# Patient Record
Sex: Female | Born: 1935 | Hispanic: No | Smoking: Never smoker
Health system: Southern US, Community
[De-identification: ages and names within clinical notes are randomized; demographics above are authoritative.]

## PROBLEM LIST (undated history)

## (undated) DIAGNOSIS — S2249XA Multiple fractures of ribs, unspecified side, initial encounter for closed fracture: Secondary | ICD-10-CM

## (undated) DIAGNOSIS — M1990 Unspecified osteoarthritis, unspecified site: Secondary | ICD-10-CM

## (undated) DIAGNOSIS — S2239XA Fracture of one rib, unspecified side, initial encounter for closed fracture: Secondary | ICD-10-CM

## (undated) DIAGNOSIS — I509 Heart failure, unspecified: Secondary | ICD-10-CM

## (undated) DIAGNOSIS — E785 Hyperlipidemia, unspecified: Secondary | ICD-10-CM

## (undated) DIAGNOSIS — I10 Essential (primary) hypertension: Secondary | ICD-10-CM

## (undated) DIAGNOSIS — Z780 Asymptomatic menopausal state: Secondary | ICD-10-CM

## (undated) DIAGNOSIS — R1310 Dysphagia, unspecified: Secondary | ICD-10-CM

## (undated) DIAGNOSIS — I2510 Atherosclerotic heart disease of native coronary artery without angina pectoris: Secondary | ICD-10-CM

## (undated) DIAGNOSIS — K760 Fatty (change of) liver, not elsewhere classified: Secondary | ICD-10-CM

## (undated) HISTORY — DX: Fracture of one rib, unspecified side, initial encounter for closed fracture: S22.39XA

## (undated) HISTORY — PX: BREAST BIOPSY: SHX20

## (undated) HISTORY — PX: APPENDECTOMY: SHX54

## (undated) HISTORY — DX: Hyperlipidemia, unspecified: E78.5

## (undated) HISTORY — DX: Essential (primary) hypertension: I10

## (undated) HISTORY — PX: OTHER SURGICAL HISTORY: SHX169

## (undated) HISTORY — DX: Atherosclerotic heart disease of native coronary artery without angina pectoris: I25.10

## (undated) HISTORY — DX: Multiple fractures of ribs, unspecified side, initial encounter for closed fracture: S22.49XA

## (undated) HISTORY — DX: Asymptomatic menopausal state: Z78.0

## (undated) HISTORY — PX: BREAST SURGERY: SHX581

## (undated) HISTORY — DX: Heart failure, unspecified: I50.9

## (undated) HISTORY — DX: Unspecified osteoarthritis, unspecified site: M19.90

---

## 1958-10-15 HISTORY — PX: BLADDER SURGERY: SHX569

## 2004-10-15 DIAGNOSIS — I2510 Atherosclerotic heart disease of native coronary artery without angina pectoris: Secondary | ICD-10-CM

## 2004-10-15 HISTORY — DX: Atherosclerotic heart disease of native coronary artery without angina pectoris: I25.10

## 2010-03-01 ENCOUNTER — Encounter: Payer: Self-pay | Admitting: Cardiology

## 2011-11-21 DIAGNOSIS — H524 Presbyopia: Secondary | ICD-10-CM | POA: Diagnosis not present

## 2011-11-21 DIAGNOSIS — E119 Type 2 diabetes mellitus without complications: Secondary | ICD-10-CM | POA: Diagnosis not present

## 2011-11-21 DIAGNOSIS — H2589 Other age-related cataract: Secondary | ICD-10-CM | POA: Diagnosis not present

## 2011-11-21 DIAGNOSIS — H4010X0 Unspecified open-angle glaucoma, stage unspecified: Secondary | ICD-10-CM | POA: Diagnosis not present

## 2011-12-31 DIAGNOSIS — H4010X0 Unspecified open-angle glaucoma, stage unspecified: Secondary | ICD-10-CM | POA: Diagnosis not present

## 2012-01-07 ENCOUNTER — Other Ambulatory Visit (HOSPITAL_COMMUNITY)
Admission: RE | Admit: 2012-01-07 | Discharge: 2012-01-07 | Disposition: A | Discharge status: Home / Self Care | Payer: Medicare Other | Source: Ambulatory Visit | Attending: Obstetrics and Gynecology | Admitting: Obstetrics and Gynecology

## 2012-01-07 ENCOUNTER — Other Ambulatory Visit: Payer: Self-pay | Admitting: Obstetrics and Gynecology

## 2012-01-07 DIAGNOSIS — Z124 Encounter for screening for malignant neoplasm of cervix: Secondary | ICD-10-CM | POA: Diagnosis not present

## 2012-01-07 DIAGNOSIS — Z1231 Encounter for screening mammogram for malignant neoplasm of breast: Secondary | ICD-10-CM

## 2012-01-07 DIAGNOSIS — Z01419 Encounter for gynecological examination (general) (routine) without abnormal findings: Secondary | ICD-10-CM | POA: Diagnosis not present

## 2012-01-15 ENCOUNTER — Ambulatory Visit (HOSPITAL_BASED_OUTPATIENT_CLINIC_OR_DEPARTMENT_OTHER)
Admission: RE | Admit: 2012-01-15 | Discharge: 2012-01-15 | Disposition: A | Discharge status: Home / Self Care | Payer: Medicare Other | Source: Ambulatory Visit | Attending: Obstetrics and Gynecology | Admitting: Obstetrics and Gynecology

## 2012-01-15 DIAGNOSIS — Z1231 Encounter for screening mammogram for malignant neoplasm of breast: Secondary | ICD-10-CM

## 2012-01-30 ENCOUNTER — Ambulatory Visit (INDEPENDENT_AMBULATORY_CARE_PROVIDER_SITE_OTHER): Payer: Medicare Other | Admitting: Internal Medicine

## 2012-01-30 ENCOUNTER — Encounter: Payer: Self-pay | Admitting: Internal Medicine

## 2012-01-30 VITALS — BP 158/72 | HR 65 | Temp 97.1°F | Resp 16 | Ht <= 58 in | Wt 134.0 lb

## 2012-01-30 VITALS — BP 148/82 | HR 68 | Temp 98.0°F | Resp 16 | Ht 63.0 in | Wt 134.0 lb

## 2012-01-30 DIAGNOSIS — M949 Disorder of cartilage, unspecified: Secondary | ICD-10-CM

## 2012-01-30 DIAGNOSIS — I2510 Atherosclerotic heart disease of native coronary artery without angina pectoris: Secondary | ICD-10-CM

## 2012-01-30 DIAGNOSIS — IMO0001 Reserved for inherently not codable concepts without codable children: Secondary | ICD-10-CM | POA: Diagnosis not present

## 2012-01-30 DIAGNOSIS — I509 Heart failure, unspecified: Secondary | ICD-10-CM | POA: Diagnosis not present

## 2012-01-30 DIAGNOSIS — M8580 Other specified disorders of bone density and structure, unspecified site: Secondary | ICD-10-CM | POA: Insufficient documentation

## 2012-01-30 DIAGNOSIS — H409 Unspecified glaucoma: Secondary | ICD-10-CM | POA: Insufficient documentation

## 2012-01-30 DIAGNOSIS — M1990 Unspecified osteoarthritis, unspecified site: Secondary | ICD-10-CM

## 2012-01-30 DIAGNOSIS — I10 Essential (primary) hypertension: Secondary | ICD-10-CM

## 2012-01-30 DIAGNOSIS — M899 Disorder of bone, unspecified: Secondary | ICD-10-CM | POA: Diagnosis not present

## 2012-01-30 DIAGNOSIS — E785 Hyperlipidemia, unspecified: Secondary | ICD-10-CM

## 2012-01-30 NOTE — Progress Notes (Signed)
Subjective:    Patient ID: Brenda Spence, female    DOB: 1926/12/01, 78 y.o.   MRN: [id number]  HPI New patient here for first visit. Patient has been in [place] for the past 7 months. Former primary care has been in [place] and in [place].    Past medical history of diabetes since age 28, high blood pressure, arthritis involving her hands and her hip, congestive heart failure, coronary artery disease, status post stenting,  Hyperlipidemia, glaucoma, and osteopenia,  Brenda Spence is overall doing well she does report that in March she had a Pap smear by Dr. Chilton Si and was told it was normal at that time. She is taking all of her medications and does not have any acute complaints today. She does have an appointment with low-power cardiology but would like to see a cardiologist here in our building. She's not had her hemoglobin A1c checked in several months.  She reports no chest pain, no SOB or LE edema.   Not on File Past Medical History  Diagnosis Date  . CHF (congestive heart failure)   . Diabetes mellitus   . Hyperlipidemia   . Hypertension   . Arthritis   . Glaucoma    History reviewed. No pertinent past surgical history. History   Social History  . Marital Status: Widowed    Spouse Name: N/A    Number of Children: N/A  . Years of Education: N/A   Occupational History  . Not on file.   Social History Main Topics  . Smoking status: Never Smoker   . Smokeless tobacco: Never Used  . Alcohol Use: No  . Drug Use: No  . Sexually Active: No   Other Topics Concern  . Not on file   Social History Narrative  . No narrative on file   History reviewed. No pertinent family history. Patient Active Problem List  Diagnoses  . Type II or unspecified type diabetes mellitus without mention of complication, uncontrolled  . CHF (congestive heart failure)  . Essential hypertension, benign  . Other and unspecified hyperlipidemia  . DJD (degenerative joint disease)  . CAD  (coronary artery disease)  . Glaucoma  . Osteopenia   Current Outpatient Prescriptions on File Prior to Visit  Medication Sig Dispense Refill  . amLODipine-benazepril (LOTREL) 5-20 MG per capsule Take 1 capsule by mouth daily.      . carvedilol (COREG) 12.5 MG tablet Take 12.5 mg by mouth 2 (two) times daily with a meal.      . furosemide (LASIX) 40 MG tablet Take 40 mg by mouth daily. Takes 1/2 daily      . glyBURIDE-metformin (GLUCOVANCE) 5-500 MG per tablet Take 1 tablet by mouth 2 (two) times daily.      . potassium chloride SA (K-DUR,KLOR-CON) 20 MEQ tablet Take 20 mEq by mouth daily. Takes 1/2      . rosuvastatin (CRESTOR) 20 MG tablet Take 20 mg by mouth daily.            Review of Systems See HPI    Objective:   Physical Exam Physical Exam  Nursing note and vitals reviewed.  Constitutional: She is oriented to person, place, and time. She appears well-developed and well-nourished.  HENT:  Head: Normocephalic and atraumatic.  Cardiovascular: Normal rate and regular rhythm. Exam reveals no gallop and no friction rub.  No murmur heard.  Pulmonary/Chest: Breath sounds normal. She has no wheezes. She has no rales.  Neurological: She is alert and oriented to person,  place, and time.  Skin: Skin is warm and dry.  Psychiatric: She has a normal mood and affect. Her behavior is normal.        Assessment & Plan:  1)  Diabetes:  Will check Hgb A1C ,  Continue current meds.  Further Management based on labs results.   2)  HTN:  Not at goal.  May need med adjustment.  Will recheck at next visit and adjust if she still has systolic HTN. 3)  Hyperlipidemia  On Crestor check tody 4)  CHF/CAD/ s/p stenting.  Will make appt with Dr. Jens Som as she would like to see cardiologist in this building.  EKG LVH nospecific ST-t achanges  SR 5)  DJD 6)  Osteopenia per her history  Will need old records.  7)  Glaucoma: she has opthalmologist

## 2012-01-30 NOTE — Patient Instructions (Signed)
Will schedule appt with Dr. Jens Som  See me in 3 months for diabetes check up  Call if any problems

## 2012-01-31 ENCOUNTER — Telehealth: Payer: Self-pay | Admitting: Internal Medicine

## 2012-01-31 LAB — LIPID PANEL
Cholesterol: 139 mg/dL (ref 0–200)
HDL: 44 mg/dL (ref >39)
LDL Cholesterol: 62 mg/dL (ref 0–99)
Total CHOL/HDL Ratio: 3.2 Ratio
Triglycerides: 165 mg/dL — ABNORMAL HIGH (ref <150)
VLDL: 33 mg/dL (ref 0–40)

## 2012-01-31 LAB — CBC WITH DIFFERENTIAL/PLATELET
Eosinophils Relative: 6 % — ABNORMAL HIGH (ref 0–5)
HCT: 37.2 % (ref 36.0–46.0)
Hemoglobin: 12.1 g/dL (ref 12.0–15.0)
Lymphocytes Relative: 35 % (ref 12–46)
Lymphs Abs: 2.3 10*3/uL (ref 0.7–4.0)
MCV: 92.3 fL (ref 78.0–100.0)
Monocytes Absolute: 0.4 10*3/uL (ref 0.1–1.0)
Monocytes Relative: 7 % (ref 3–12)
Neutro Abs: 3.4 10*3/uL (ref 1.7–7.7)
RBC: 4.03 MIL/uL (ref 3.87–5.11)
RDW: 11.7 % (ref 11.5–15.5)
WBC: 6.6 10*3/uL (ref 4.0–10.5)

## 2012-01-31 LAB — COMPREHENSIVE METABOLIC PANEL
ALT: 15 U/L (ref 0–35)
BUN: 25 mg/dL — ABNORMAL HIGH (ref 6–23)
CO2: 30 mEq/L (ref 19–32)
Creat: 1.19 mg/dL — ABNORMAL HIGH (ref 0.50–1.10)
Total Bilirubin: 0.6 mg/dL (ref 0.3–1.2)

## 2012-01-31 NOTE — Telephone Encounter (Signed)
Spoke with pt.  See labs.  Creatinine slighly elevated on 40 mg daily lasix.  She reports she has not been drinking much fluids.  Advised to increase fluid intake and I will recheck labs in 6 weeks.  Calcium also slightly high.   She has been on "two calcium tablets daily"   Advised to reduce calcium to one tablet a day and I will recheck in 6 weeks  Hgb AIC may need med adjustment but will recheck at next visit.   She voices understanding

## 2012-02-04 ENCOUNTER — Encounter: Payer: Self-pay | Admitting: *Deleted

## 2012-02-08 NOTE — Progress Notes (Signed)
Copy of labs mailed to pt.

## 2012-02-12 ENCOUNTER — Ambulatory Visit (HOSPITAL_BASED_OUTPATIENT_CLINIC_OR_DEPARTMENT_OTHER): Payer: Self-pay

## 2012-02-14 NOTE — Progress Notes (Signed)
  Subjective:    Patient ID: Brenda Spence, female    DOB: 06/08/1927, 78 y.o.   MRN: [id number]  HPI    Review of Systems     Objective:   Physical Exam        Assessment & Plan:  Error

## 2012-02-25 ENCOUNTER — Encounter: Payer: Self-pay | Admitting: Internal Medicine

## 2012-02-26 DIAGNOSIS — H409 Unspecified glaucoma: Secondary | ICD-10-CM | POA: Diagnosis not present

## 2012-02-28 ENCOUNTER — Ambulatory Visit: Payer: Self-pay | Admitting: Internal Medicine

## 2012-03-03 ENCOUNTER — Telehealth: Payer: Self-pay | Admitting: Internal Medicine

## 2012-03-03 NOTE — Telephone Encounter (Signed)
Called patient and infomred her Dr. Would need to see her , gave her an appt in am.

## 2012-03-03 NOTE — Telephone Encounter (Signed)
Pt needs office visit

## 2012-03-03 NOTE — Telephone Encounter (Signed)
Patient called in requesting an antibiotic Rx to be called in for her.  She has had a productive cough since 02/21/12.  I instructed patient that most likely Dr. Constance Goltz would want to see her to evaluate her first.  I told her either the nurse or Dr. Eulah Citizen call her back to confirm her plans.

## 2012-03-04 ENCOUNTER — Ambulatory Visit (INDEPENDENT_AMBULATORY_CARE_PROVIDER_SITE_OTHER): Payer: Medicare Other | Admitting: Internal Medicine

## 2012-03-04 ENCOUNTER — Encounter: Payer: Self-pay | Admitting: Internal Medicine

## 2012-03-04 ENCOUNTER — Ambulatory Visit (HOSPITAL_BASED_OUTPATIENT_CLINIC_OR_DEPARTMENT_OTHER)
Admission: RE | Admit: 2012-03-04 | Discharge: 2012-03-04 | Disposition: A | Discharge status: Home / Self Care | Payer: Medicare Other | Source: Ambulatory Visit | Attending: Internal Medicine | Admitting: Internal Medicine

## 2012-03-04 VITALS — BP 143/75 | HR 77 | Temp 97.3°F | Ht 63.0 in | Wt 134.0 lb

## 2012-03-04 DIAGNOSIS — J40 Bronchitis, not specified as acute or chronic: Secondary | ICD-10-CM

## 2012-03-04 DIAGNOSIS — R059 Cough, unspecified: Secondary | ICD-10-CM | POA: Insufficient documentation

## 2012-03-04 DIAGNOSIS — R0602 Shortness of breath: Secondary | ICD-10-CM | POA: Insufficient documentation

## 2012-03-04 DIAGNOSIS — J189 Pneumonia, unspecified organism: Secondary | ICD-10-CM

## 2012-03-04 DIAGNOSIS — R799 Abnormal finding of blood chemistry, unspecified: Secondary | ICD-10-CM | POA: Diagnosis not present

## 2012-03-04 DIAGNOSIS — I700 Atherosclerosis of aorta: Secondary | ICD-10-CM | POA: Diagnosis not present

## 2012-03-04 DIAGNOSIS — R7989 Other specified abnormal findings of blood chemistry: Secondary | ICD-10-CM

## 2012-03-04 DIAGNOSIS — R05 Cough: Secondary | ICD-10-CM | POA: Insufficient documentation

## 2012-03-04 LAB — CBC WITH DIFFERENTIAL/PLATELET
Basophils Absolute: 0 10*3/uL (ref 0.0–0.1)
Basophils Relative: 0 % (ref 0–1)
Eosinophils Relative: 6 % — ABNORMAL HIGH (ref 0–5)
Lymphocytes Relative: 22 % (ref 12–46)
MCHC: 32.8 g/dL (ref 30.0–36.0)
Neutro Abs: 4.7 10*3/uL (ref 1.7–7.7)
Platelets: 497 10*3/uL — ABNORMAL HIGH (ref 150–400)
RDW: 12.4 % (ref 11.5–15.5)
WBC: 7.4 10*3/uL (ref 4.0–10.5)

## 2012-03-04 LAB — COMPREHENSIVE METABOLIC PANEL
ALT: 12 U/L (ref 0–35)
CO2: 26 mEq/L (ref 19–32)
Calcium: 9.1 mg/dL (ref 8.4–10.5)
Chloride: 101 mEq/L (ref 96–112)
Potassium: 4.7 mEq/L (ref 3.5–5.3)
Sodium: 139 mEq/L (ref 135–145)
Total Protein: 7.2 g/dL (ref 6.0–8.3)

## 2012-03-04 MED ORDER — CEFTRIAXONE SODIUM 1 G IJ SOLR
500.0000 mg | Freq: Once | INTRAMUSCULAR | Status: AC
  Administered 2012-03-04: 500 mg via INTRAMUSCULAR

## 2012-03-04 MED ORDER — AZITHROMYCIN 250 MG PO TABS: ORAL_TABLET | ORAL | Status: AC

## 2012-03-04 NOTE — Patient Instructions (Signed)
See me on Thursday afternoon

## 2012-03-04 NOTE — Progress Notes (Signed)
Subjective:    Patient ID: Brenda Spence, female    DOB: 10-01-1927, 78 y.o.   MRN: [id number]  HPI Kamoria is here with her daughter.  She has been having 2 weeks of chest congestion and cough,.  Chest pain occures when coughing.  T 99.5 at home.  Some DOE.  Cough productive of green mucous.  She has had laryngitis over last 2 days  No Known Allergies Past Medical History  Diagnosis Date  . Diabetes mellitus   . Post-menopausal   . Heart disease 2006    CHF  . Broken ribs   . CHF (congestive heart failure)   . Diabetes mellitus   . Hyperlipidemia   . Hypertension   . Arthritis   . Glaucoma    Past Surgical History  Procedure Date  . Breast surgery     fatty tissue  . Appendectomy   . Bladder surgery 1960  . Stent implant     heart   History   Social History  . Marital Status: Widowed    Spouse Name: N/A    Number of Children: N/A  . Years of Education: N/A   Occupational History  . retired    Social History Main Topics  . Smoking status: Never Smoker   . Smokeless tobacco: Never Used  . Alcohol Use: No  . Drug Use: No  . Sexually Active: No   Other Topics Concern  . Not on file   Social History Narrative   ** Merged History Encounter **    Family History  Problem Relation Age of Onset  . Diabetes Sister   . Cancer Sister     stomach  . Stroke Brother    Patient Active Problem List  Diagnoses  . Type II or unspecified type diabetes mellitus without mention of complication, uncontrolled  . CHF (congestive heart failure)  . Essential hypertension, benign  . Other and unspecified hyperlipidemia  . DJD (degenerative joint disease)  . CAD (coronary artery disease)  . Glaucoma  . Osteopenia   Current Outpatient Prescriptions on File Prior to Visit  Medication Sig Dispense Refill  . amLODipine-benazepril (LOTREL) 5-20 MG per capsule Take 1 capsule by mouth 2 (two) times daily.       Marland Kitchen aspirin 81 MG tablet Take 81 mg by mouth daily.      .  carvedilol (COREG) 12.5 MG tablet Take 12.5 mg by mouth 2 (two) times daily with a meal.      . clopidogrel (PLAVIX) 75 MG tablet Take 75 mg by mouth daily.      . furosemide (LASIX) 40 MG tablet Take 40 mg by mouth daily. Takes 1/2 daily      . glyBURIDE-metformin (GLUCOVANCE) 5-500 MG per tablet Take 1 tablet by mouth 2 (two) times daily.      Marland Kitchen latanoprost (XALATAN) 0.005 % ophthalmic solution 1 drop at bedtime.      . potassium chloride SA (K-DUR,KLOR-CON) 20 MEQ tablet Take 20 mEq by mouth daily. Takes 1/2      . rosuvastatin (CRESTOR) 20 MG tablet Take 20 mg by mouth daily.      Marland Kitchen DISCONTD: amLODipine-benazepril (LOTREL) 5-20 MG per capsule       . DISCONTD: carvedilol (COREG) 12.5 MG tablet       . DISCONTD: furosemide (LASIX) 40 MG tablet Take 40 mg by mouth daily. Take 1/2 daily      . DISCONTD: glyBURIDE-metformin (GLUCOVANCE) 5-500 MG per tablet Take 1 tablet by mouth  2 (two) times daily.      Marland Kitchen DISCONTD: rosuvastatin (CRESTOR) 20 MG tablet Take 20 mg by mouth daily.       No current facility-administered medications on file prior to visit.       Review of Systems    see HPI Objective:   Physical Exam Physical Exam  Nursing note and vitals reviewed.  Constitutional: She is oriented to person, place, and time. She appears well-developed and well-nourished.  HENT:  Head: Normocephalic and atraumatic.  Cardiovascular: Normal rate and regular rhythm. Exam reveals no gallop and no friction rub.  No murmur heard.  Pulmonary/Chest: Breath sounds normal. She has no wheezes. She has no rales. Lungs  Few rales L base Neurological: She is alert and oriented to person, place, and time.  Skin: Skin is warm and dry.  Psychiatric: She has a normal mood and affect. Her behavior is normal.         Assessment & Plan:  1)  Probable early pneumonia  Will get CXR today . Rocephin 500 mg in office and Z-pak  .  OTC cough med for now 2)  Hypercalcemia  Will repeat today 3)  Elevated  creatianine.  She is taking 1/2 of her 40 mg lasix dose .  Rechedk today

## 2012-03-04 NOTE — Progress Notes (Signed)
Here today for cough- was productive, c/o congestion in chest. Also needs refill on Crestor

## 2012-03-06 ENCOUNTER — Ambulatory Visit (INDEPENDENT_AMBULATORY_CARE_PROVIDER_SITE_OTHER): Payer: Medicare Other | Admitting: Internal Medicine

## 2012-03-06 ENCOUNTER — Ambulatory Visit (HOSPITAL_BASED_OUTPATIENT_CLINIC_OR_DEPARTMENT_OTHER)
Admission: RE | Admit: 2012-03-06 | Discharge: 2012-03-06 | Disposition: A | Discharge status: Home / Self Care | Payer: Medicare Other | Source: Ambulatory Visit | Attending: Internal Medicine | Admitting: Internal Medicine

## 2012-03-06 ENCOUNTER — Encounter: Payer: Self-pay | Admitting: Internal Medicine

## 2012-03-06 VITALS — BP 160/74 | HR 76 | Temp 97.5°F | Resp 16 | Ht 65.0 in | Wt 137.0 lb

## 2012-03-06 DIAGNOSIS — N3289 Other specified disorders of bladder: Secondary | ICD-10-CM | POA: Insufficient documentation

## 2012-03-06 DIAGNOSIS — IMO0001 Reserved for inherently not codable concepts without codable children: Secondary | ICD-10-CM | POA: Diagnosis not present

## 2012-03-06 DIAGNOSIS — N289 Disorder of kidney and ureter, unspecified: Secondary | ICD-10-CM

## 2012-03-06 DIAGNOSIS — N1330 Unspecified hydronephrosis: Secondary | ICD-10-CM | POA: Diagnosis not present

## 2012-03-06 LAB — HEMOGLOBIN A1C: Mean Plasma Glucose: 169 mg/dL — ABNORMAL HIGH (ref <117)

## 2012-03-06 NOTE — Progress Notes (Signed)
Subjective:    Patient ID: Brenda Spence, female    DOB: 03-29-1927, 78 y.o.   MRN: [id number]  HPI Brenda Spence is here for follow up poss early pneumonia  .  She is feeling much better.  Less cough.  No fever.   See labs creatinine increasing. She is on Furosemide 20 mg daily down from 40 mg. Her calcium is normal now she is on one tablet daily.  She has not had glucometer strips so has not been checking her FBS   No Known Allergies Past Medical History  Diagnosis Date  . Diabetes mellitus   . Post-menopausal   . Heart disease 2006    CHF  . Broken ribs   . CHF (congestive heart failure)   . Diabetes mellitus   . Hyperlipidemia   . Hypertension   . Arthritis   . Glaucoma    Past Surgical History  Procedure Date  . Breast surgery     fatty tissue  . Appendectomy   . Bladder surgery 1960  . Stent implant     heart   History   Social History  . Marital Status: Widowed    Spouse Name: N/A    Number of Children: N/A  . Years of Education: N/A   Occupational History  . retired    Social History Main Topics  . Smoking status: Never Smoker   . Smokeless tobacco: Never Used  . Alcohol Use: No  . Drug Use: No  . Sexually Active: No   Other Topics Concern  . Not on file   Social History Narrative   ** Merged History Encounter **    Family History  Problem Relation Age of Onset  . Diabetes Sister   . Cancer Sister     stomach  . Stroke Brother    Patient Active Problem List  Diagnoses  . Type II or unspecified type diabetes mellitus without mention of complication, uncontrolled  . CHF (congestive heart failure)  . Essential hypertension, benign  . Other and unspecified hyperlipidemia  . DJD (degenerative joint disease)  . CAD (coronary artery disease)  . Glaucoma  . Osteopenia  . Hypercalcemia  . Elevated serum creatinine   Current Outpatient Prescriptions on File Prior to Visit  Medication Sig Dispense Refill  . amLODipine-benazepril (LOTREL) 5-20 MG  per capsule Take 1 capsule by mouth 2 (two) times daily.       Marland Kitchen aspirin 81 MG tablet Take 81 mg by mouth daily.      Marland Kitchen azithromycin (ZITHROMAX Z-PAK) 250 MG tablet Take as directed  Z-pak  6 each  0  . carvedilol (COREG) 12.5 MG tablet Take 12.5 mg by mouth 2 (two) times daily with a meal.      . clopidogrel (PLAVIX) 75 MG tablet Take 75 mg by mouth daily.      . furosemide (LASIX) 40 MG tablet Take 40 mg by mouth daily. Takes 1/2 daily      . glyBURIDE-metformin (GLUCOVANCE) 5-500 MG per tablet Take 1 tablet by mouth 2 (two) times daily.      Marland Kitchen latanoprost (XALATAN) 0.005 % ophthalmic solution 1 drop at bedtime.      . potassium chloride SA (K-DUR,KLOR-CON) 20 MEQ tablet Take 20 mEq by mouth daily. Takes 1/2      . rosuvastatin (CRESTOR) 20 MG tablet Take 20 mg by mouth daily.            Review of Systems See HPI    Objective:  Physical Exam Physical Exam  Nursing note and vitals reviewed.  Constitutional: She is oriented to person, place, and time. She appears well-developed and well-nourished.  HENT:  Head: Normocephalic and atraumatic.  Cardiovascular: Normal rate and regular rhythm. Exam reveals no gallop and no friction rub.  No murmur heard.  Pulmonary/Chest: Breath sounds normal. She has no wheezes. She has no rales.  Neurological: She is alert and oriented to person, place, and time.  Skin: Skin is warm and dry.  Psychiatric: She has a normal mood and affect. Her behavior is normal.         Assessment & Plan:  1)  Bronchitis/poss early pneumonia.  Improving 2)  Renal insufficiency:  Will get renal ultrasound, get micral today.  She is on ACE I.  Will refer to nephrology.  Pt also counseled to discontinue her postassium tablets 3)   DM  Check AIC today, urine micral 4)  HTN  No at goal.

## 2012-03-06 NOTE — Patient Instructions (Signed)
Stop potassium pill   Will set up referral to kidney doctors  To have kidney ultrasound test.    Labs will be mailed to you

## 2012-03-12 ENCOUNTER — Encounter: Payer: Self-pay | Admitting: Cardiology

## 2012-03-12 ENCOUNTER — Ambulatory Visit (INDEPENDENT_AMBULATORY_CARE_PROVIDER_SITE_OTHER): Payer: Medicare Other | Admitting: Cardiology

## 2012-03-12 VITALS — BP 180/76 | HR 72 | Ht 63.0 in | Wt 136.0 lb

## 2012-03-12 DIAGNOSIS — R0609 Other forms of dyspnea: Secondary | ICD-10-CM

## 2012-03-12 DIAGNOSIS — I509 Heart failure, unspecified: Secondary | ICD-10-CM | POA: Diagnosis not present

## 2012-03-12 DIAGNOSIS — R0989 Other specified symptoms and signs involving the circulatory and respiratory systems: Secondary | ICD-10-CM | POA: Diagnosis not present

## 2012-03-12 DIAGNOSIS — E785 Hyperlipidemia, unspecified: Secondary | ICD-10-CM | POA: Diagnosis not present

## 2012-03-12 DIAGNOSIS — R0600 Dyspnea, unspecified: Secondary | ICD-10-CM

## 2012-03-12 DIAGNOSIS — I2510 Atherosclerotic heart disease of native coronary artery without angina pectoris: Secondary | ICD-10-CM | POA: Diagnosis not present

## 2012-03-12 NOTE — Progress Notes (Signed)
HPI: 78 year old female for evaluation of coronary artery disease. Patient has had previous PCI of the right coronary artery in 2006. Patient previously cared for in [facility]. She moved here approximately one year ago to be close to her daughter. She has noticed some dyspnea on exertion but there is no orthopnea, PND, palpitations, syncope or chest pain. Occasional mild pedal edema.  Current Outpatient Prescriptions  Medication Sig Dispense Refill  . amLODipine-benazepril (LOTREL) 5-20 MG per capsule Take 1 capsule by mouth 2 (two) times daily.       Marland Kitchen aspirin 81 MG tablet Take 81 mg by mouth daily.      . carvedilol (COREG) 12.5 MG tablet Take 12.5 mg by mouth 2 (two) times daily with a meal.      . clopidogrel (PLAVIX) 75 MG tablet Take 75 mg by mouth daily.      . furosemide (LASIX) 40 MG tablet Take 20 mg by mouth daily.       Marland Kitchen glyBURIDE-metformin (GLUCOVANCE) 5-500 MG per tablet Take 1 tablet by mouth 2 (two) times daily.      Marland Kitchen latanoprost (XALATAN) 0.005 % ophthalmic solution Place 1 drop into both eyes at bedtime.       . rosuvastatin (CRESTOR) 20 MG tablet Take 20 mg by mouth daily.        No Known Allergies  Past Medical History  Diagnosis Date  . Post-menopausal   . CAD (coronary artery disease) 2006    PCI of RCA in [place], [place]  . Broken ribs   . CHF (congestive heart failure)   . Diabetes mellitus   . Hyperlipidemia   . Hypertension   . Arthritis   . Glaucoma     Past Surgical History  Procedure Date  . Breast surgery     fatty tissue  . Appendectomy   . Bladder surgery 1960  . Stent implant     heart    History   Social History  . Marital Status: Widowed    Spouse Name: N/A    Number of Children: 4  . Years of Education: N/A   Occupational History  . retired    Social History Main Topics  . Smoking status: Never Smoker   . Smokeless tobacco: Never Used  . Alcohol Use: No  . Drug Use: No  . Sexually Active: No   Other Topics  Concern  . Not on file   Social History Narrative   ** Merged History Encounter **     Family History  Problem Relation Age of Onset  . Diabetes Sister   . Cancer Sister     stomach  . Stroke Brother   . Coronary artery disease Mother     ROS:  no fevers or chills, productive cough, hemoptysis, dysphasia, odynophagia, melena, hematochezia, dysuria, hematuria, rash, seizure activity, orthopnea, PND, pedal edema, claudication. Remaining systems are negative.  Physical Exam:   Blood pressure 180/76, pulse 72, height 5\' 3"  (1.6 m), weight 61.707 kg (136 lb 0.6 oz).  General:  Well developed/well nourished in NAD Skin warm/dry Patient not depressed No peripheral clubbing Back-normal HEENT-normal/normal eyelids Neck supple/normal carotid upstroke bilaterally; no bruits; no JVD; no thyromegaly chest - CTA/ normal expansion CV - RRR/normal S1 and S2; no murmurs, rubs or gallops;  PMI nondisplaced Abdomen -NT/ND, no HSM, no mass, + bowel sounds, no bruit 2+ femoral pulses, no bruits Ext-trace edema, no chords, 2+ DP Neuro-grossly nonfocal  ECG 01/30/2012-sinus rhythm at a rate of 63.  Left ventricular hypertrophy. Repolarization abnormalities.

## 2012-03-12 NOTE — Assessment & Plan Note (Signed)
Patient carries the diagnosis of congestive heart failure. Continue present medications. Check BNP given complain of dyspnea. Will increase diuretics if needed.

## 2012-03-12 NOTE — Patient Instructions (Signed)
Your physician wants you to follow-up in: 6 MONTHS WITH DR Jens Som You will receive a reminder letter in the mail two months in advance. If you don't receive a letter, please call our office to schedule the follow-up appointment.   Your physician has requested that you have a lexiscan myoview. For further information please visit [email or web address] Please follow instruction sheet, as given.   STOP PLAVIX  Your physician recommends that you HAVE LAB WORK TODAY

## 2012-03-12 NOTE — Assessment & Plan Note (Signed)
Blood pressure is elevated. Continue present medications. I have asked her to follow her pressure at home and we will increase medications as needed.

## 2012-03-12 NOTE — Assessment & Plan Note (Signed)
Continue aspirin. Discontinue Plavix. Continue statin. She does have some dyspnea. Scheduled Myoview to exclude ischemia and quantitate LV function. Obtain records from [place] concerning previous cardiac history.

## 2012-03-12 NOTE — Assessment & Plan Note (Signed)
Continue statin. 

## 2012-03-13 LAB — BRAIN NATRIURETIC PEPTIDE: Brain Natriuretic Peptide: 65.8 pg/mL (ref 0.0–100.0)

## 2012-03-17 ENCOUNTER — Telehealth: Payer: Self-pay | Admitting: *Deleted

## 2012-03-17 ENCOUNTER — Other Ambulatory Visit: Payer: Self-pay | Admitting: Internal Medicine

## 2012-03-17 DIAGNOSIS — N1330 Unspecified hydronephrosis: Secondary | ICD-10-CM

## 2012-03-17 NOTE — Telephone Encounter (Signed)
Received a fax stating insurance did not cover  crestor and asking for substitution- please consider generics: Atorvastatin, fluvastatin, pravastatin, simvastatin- 90 day supply

## 2012-03-17 NOTE — Telephone Encounter (Signed)
Spoke with pt. And informed of ultrasound results .  Will mild hydronephrosis on Right  Will refer to urology.  She has appt with Dr. Margarita Grizzle scheduled on 6/6 this week.  I advised pt to take her daughter with her.  She voices understanding

## 2012-03-18 ENCOUNTER — Ambulatory Visit (HOSPITAL_COMMUNITY): Payer: Medicare Other | Attending: Cardiovascular Disease | Admitting: Radiology

## 2012-03-18 VITALS — BP 137/65 | Ht 63.0 in | Wt 134.0 lb

## 2012-03-18 DIAGNOSIS — E119 Type 2 diabetes mellitus without complications: Secondary | ICD-10-CM | POA: Insufficient documentation

## 2012-03-18 DIAGNOSIS — I10 Essential (primary) hypertension: Secondary | ICD-10-CM | POA: Insufficient documentation

## 2012-03-18 DIAGNOSIS — Z9861 Coronary angioplasty status: Secondary | ICD-10-CM | POA: Insufficient documentation

## 2012-03-18 DIAGNOSIS — R0609 Other forms of dyspnea: Secondary | ICD-10-CM | POA: Diagnosis not present

## 2012-03-18 DIAGNOSIS — R0602 Shortness of breath: Secondary | ICD-10-CM

## 2012-03-18 DIAGNOSIS — R5381 Other malaise: Secondary | ICD-10-CM | POA: Insufficient documentation

## 2012-03-18 DIAGNOSIS — R0989 Other specified symptoms and signs involving the circulatory and respiratory systems: Secondary | ICD-10-CM | POA: Insufficient documentation

## 2012-03-18 DIAGNOSIS — I2510 Atherosclerotic heart disease of native coronary artery without angina pectoris: Secondary | ICD-10-CM | POA: Diagnosis not present

## 2012-03-18 DIAGNOSIS — E785 Hyperlipidemia, unspecified: Secondary | ICD-10-CM | POA: Diagnosis not present

## 2012-03-18 DIAGNOSIS — R9431 Abnormal electrocardiogram [ECG] [EKG]: Secondary | ICD-10-CM | POA: Diagnosis not present

## 2012-03-18 DIAGNOSIS — I509 Heart failure, unspecified: Secondary | ICD-10-CM

## 2012-03-18 DIAGNOSIS — Z8249 Family history of ischemic heart disease and other diseases of the circulatory system: Secondary | ICD-10-CM | POA: Insufficient documentation

## 2012-03-18 MED ORDER — REGADENOSON 0.4 MG/5ML IV SOLN
0.4000 mg | Freq: Once | INTRAVENOUS | Status: AC
  Administered 2012-03-18: 0.4 mg via INTRAVENOUS

## 2012-03-18 MED ORDER — TECHNETIUM TC 99M TETROFOSMIN IV KIT
33.0000 | PACK | Freq: Once | INTRAVENOUS | Status: AC | PRN
  Administered 2012-03-18: 33 via INTRAVENOUS

## 2012-03-18 MED ORDER — TECHNETIUM TC 99M TETROFOSMIN IV KIT
11.0000 | PACK | Freq: Once | INTRAVENOUS | Status: AC | PRN
  Administered 2012-03-18: 11 via INTRAVENOUS

## 2012-03-18 NOTE — Progress Notes (Signed)
[facility] SITE 3 NUCLEAR MED [address] [place] [address] [phone]  Cardiology Nuclear Med Study  Brenda Spence is a 78 y.o. female     MRN : [id number]     DOB: [birth year]  Procedure Date: 03/18/2012  Nuclear Med Background Indication for Stress Test:  Evaluation for Ischemia and PTCA/Stent Patency History:  '06 Cath ([place])>PTCA/Stent RCA, '06 CHF, Abnormal EKG (LVH with strain) Cardiac Risk Factors: Family History - CAD, Hypertension, Lipids and NIDDM  Symptoms:  DOE, Fatigue, Fatigue with Exertion and SOB   Nuclear Pre-Procedure Caffeine/Decaff Intake:  None NPO After: 7:00am   Lungs:  clear O2 Sat: 98% on room air. IV 0.9% NS with Angio Cath:  20g  IV Site: R Antecubital  IV Started by:  Stanton Kidney, EMT-P  Chest Size (in):  36 Cup Size: B  Height: 5\' 3"  (1.6 m)  Weight:  134 lb (60.782 kg)  BMI:  Body mass index is 23.74 kg/(m^2). Tech Comments:  Meds taken as directed @ 7:30am, per patient. CBG= 212 @ 10:30am, per patient.    Nuclear Med Study 1 or 2 day study: 1 day  Stress Test Type:  Eugenie Birks  Reading MD: Charlton Haws, MD  Order Authorizing Provider:  Olga Millers, MD  Resting Radionuclide: Technetium 30m Tetrofosmin  Resting Radionuclide Dose: 11.0 mCi   Stress Radionuclide:  Technetium 39m Tetrofosmin  Stress Radionuclide Dose: 33.0 mCi           Stress Protocol Rest HR: 62 Stress HR: 91  Rest BP: 137/65 Stress BP: 141/59  Exercise Time (min): n/a METS: n/a   Predicted Max HR: 138 bpm % Max HR: 65.94 bpm Rate Pressure Product: 21308   Dose of Adenosine (mg):  n/a Dose of Lexiscan: 0.4 mg  Dose of Atropine (mg): n/a Dose of Dobutamine: n/a mcg/kg/min (at max HR)  Stress Test Technologist: Irean Hong, RN  Nuclear Technologist:  Domenic Polite, CNMT     Rest Procedure:  Myocardial perfusion imaging was performed at rest 45 minutes following the intravenous administration of Technetium 63m Tetrofosmin. Rest ECG: NSR,  LVH with replorizaion  Stress Procedure:  The patient received IV Lexiscan 0.4 mg over 15-seconds.  Technetium 40m Tetrofosmin injected at 30-seconds.  The EKG was non diagnostic due to baseline changes.  Quantitative spect images were obtained after a 45 minute delay. Stress ECG: No significant change from baseline ECG  QPS Raw Data Images:  Normal; no motion artifact; normal heart/lung ratio. Stress Images:  Normal homogeneous uptake in all areas of the myocardium. Rest Images:  Normal homogeneous uptake in all areas of the myocardium. Subtraction (SDS):  Normal Transient Ischemic Dilatation (Normal <1.22):  1.03 Lung/Heart Ratio (Normal <0.45):  0.34  Quantitative Gated Spect Images QGS EDV:  81 ml QGS ESV:  37 ml  Impression Exercise Capacity:  Lexiscan with no exercise. BP Response:  Normal blood pressure response. Clinical Symptoms:  There is dyspnea. ECG Impression:  No significant ST segment change suggestive of ischemia. Comparison with Prior Nuclear Study: No previous nuclear study performed  Overall Impression:  Normal stress nuclear study.  LV Ejection Fraction: 55%.  LV Wall Motion:  NL LV Function; NL Wall Motion    Charlton Haws

## 2012-03-20 ENCOUNTER — Telehealth: Payer: Self-pay | Admitting: *Deleted

## 2012-03-20 ENCOUNTER — Ambulatory Visit: Payer: Medicare Other | Admitting: Internal Medicine

## 2012-03-20 ENCOUNTER — Other Ambulatory Visit: Payer: Self-pay | Admitting: Internal Medicine

## 2012-03-20 ENCOUNTER — Telehealth: Payer: Self-pay | Admitting: Internal Medicine

## 2012-03-20 DIAGNOSIS — N1330 Unspecified hydronephrosis: Secondary | ICD-10-CM | POA: Diagnosis not present

## 2012-03-20 DIAGNOSIS — R3913 Splitting of urinary stream: Secondary | ICD-10-CM | POA: Diagnosis not present

## 2012-03-20 DIAGNOSIS — R339 Retention of urine, unspecified: Secondary | ICD-10-CM | POA: Diagnosis not present

## 2012-03-20 NOTE — Telephone Encounter (Signed)
Spoke with pt and informed her insurance plan does not want to pay for Crestor.  Lipids look very good.  Pt would like to try to stay lipid med and see if her cholesterol goes very high.  I am agreeable with this.  Recheck lipids 6 mlnths to one year

## 2012-03-20 NOTE — Telephone Encounter (Signed)
Copy of labs mailed to pt's home address. 

## 2012-03-20 NOTE — Telephone Encounter (Signed)
Copy of pt's labs mailed to her home address.

## 2012-03-21 ENCOUNTER — Other Ambulatory Visit (HOSPITAL_COMMUNITY): Payer: Self-pay | Admitting: Urology

## 2012-03-21 DIAGNOSIS — N1330 Unspecified hydronephrosis: Secondary | ICD-10-CM

## 2012-04-06 ENCOUNTER — Encounter: Payer: Self-pay | Admitting: Internal Medicine

## 2012-04-06 DIAGNOSIS — N1330 Unspecified hydronephrosis: Secondary | ICD-10-CM | POA: Insufficient documentation

## 2012-04-08 ENCOUNTER — Encounter (HOSPITAL_COMMUNITY)
Admission: RE | Admit: 2012-04-08 | Discharge: 2012-04-08 | Disposition: A | Discharge status: Home / Self Care | Payer: Medicare Other | Source: Ambulatory Visit | Attending: Urology | Admitting: Urology

## 2012-04-08 DIAGNOSIS — N1330 Unspecified hydronephrosis: Secondary | ICD-10-CM | POA: Diagnosis not present

## 2012-04-08 MED ORDER — FUROSEMIDE 10 MG/ML IJ SOLN
40.0000 mg | Freq: Once | INTRAMUSCULAR | Status: DC
  Filled 2012-04-08: qty 4

## 2012-04-08 MED ORDER — TECHNETIUM TC 99M MERTIATIDE
15.7000 | Freq: Once | INTRAVENOUS | Status: AC | PRN
  Administered 2012-04-08: 16 via INTRAVENOUS

## 2012-04-18 DIAGNOSIS — I10 Essential (primary) hypertension: Secondary | ICD-10-CM | POA: Diagnosis not present

## 2012-04-18 DIAGNOSIS — I129 Hypertensive chronic kidney disease with stage 1 through stage 4 chronic kidney disease, or unspecified chronic kidney disease: Secondary | ICD-10-CM | POA: Diagnosis not present

## 2012-04-21 DIAGNOSIS — N1330 Unspecified hydronephrosis: Secondary | ICD-10-CM | POA: Diagnosis not present

## 2012-04-22 ENCOUNTER — Encounter: Payer: Self-pay | Admitting: Internal Medicine

## 2012-04-22 ENCOUNTER — Ambulatory Visit (INDEPENDENT_AMBULATORY_CARE_PROVIDER_SITE_OTHER): Payer: Medicare Other | Admitting: Internal Medicine

## 2012-04-22 VITALS — BP 136/62 | HR 66 | Temp 97.2°F | Resp 16 | Ht 63.0 in | Wt 137.0 lb

## 2012-04-22 DIAGNOSIS — N289 Disorder of kidney and ureter, unspecified: Secondary | ICD-10-CM

## 2012-04-22 NOTE — Progress Notes (Signed)
Subjective:    Patient ID: Brenda Spence, female    DOB: November 12, 1926, 78 y.o.   MRN: [id number]  HPI  Keaghan is here for follow up.  She has seen both Dr. Margarita Grizzle for R sided hydronephrosis and Dr. Eliott Nine nephrology.  She has renogram pending to evaluate her upper tract for any blockage.  See scanned not Dr. Margarita Grizzle - bladder function appears fine.    No chest pain or SOB.    No numbness or tingling in toes  See AIC  She states she knows she is not following diet as well ass she should.  She is afraid to use sugar substitute and does drink sugary sodas at times.    No Known Allergies Past Medical History  Diagnosis Date  . Post-menopausal   . CAD (coronary artery disease) 2006    PCI of RCA in [place], [place]  . Broken ribs   . CHF (congestive heart failure)   . Diabetes mellitus   . Hyperlipidemia   . Hypertension   . Arthritis   . Glaucoma    Past Surgical History  Procedure Date  . Breast surgery     fatty tissue  . Appendectomy   . Bladder surgery 1960  . Stent implant     heart   History   Social History  . Marital Status: Widowed    Spouse Name: N/A    Number of Children: 4  . Years of Education: N/A   Occupational History  . retired    Social History Main Topics  . Smoking status: Never Smoker   . Smokeless tobacco: Never Used  . Alcohol Use: No  . Drug Use: No  . Sexually Active: No   Other Topics Concern  . Not on file   Social History Narrative   ** Merged History Encounter **    Family History  Problem Relation Age of Onset  . Diabetes Sister   . Cancer Sister     stomach  . Stroke Brother   . Coronary artery disease Mother    Patient Active Problem List  Diagnosis  . Type II or unspecified type diabetes mellitus without mention of complication, uncontrolled  . CHF (congestive heart failure)  . Essential hypertension, benign  . Other and unspecified hyperlipidemia  . DJD (degenerative joint disease)  . CAD (coronary artery disease)    . Glaucoma  . Osteopenia  . Hypercalcemia  . Elevated serum creatinine  . Hydronephrosis of right kidney   Current Outpatient Prescriptions on File Prior to Visit  Medication Sig Dispense Refill  . amLODipine-benazepril (LOTREL) 5-20 MG per capsule Take 1 capsule by mouth 2 (two) times daily.       Marland Kitchen aspirin 81 MG tablet Take 81 mg by mouth daily.      . carvedilol (COREG) 12.5 MG tablet Take 12.5 mg by mouth 2 (two) times daily with a meal.      . furosemide (LASIX) 40 MG tablet Take 20 mg by mouth daily.       Marland Kitchen glyBURIDE-metformin (GLUCOVANCE) 5-500 MG per tablet Take 1 tablet by mouth 2 (two) times daily.      Marland Kitchen latanoprost (XALATAN) 0.005 % ophthalmic solution Place 1 drop into both eyes at bedtime.           Review of Systems    see HPI Objective:   Physical Exam Physical Exam  Nursing note and vitals reviewed.  Constitutional: She is oriented to person, place, and time. She appears well-developed  and well-nourished.  HENT:  Head: Normocephalic and atraumatic.  Cardiovascular: Normal rate and regular rhythm. Exam reveals no gallop and no friction rub.  No murmur heard.  Pulmonary/Chest: Breath sounds normal. She has no wheezes. She has no rales.  Neurological: She is alert and oriented to person, place, and time.  Skin: Skin is warm and dry.  Psychiatric: She has a normal mood and affect. Her behavior is normal.  Foot exam  No rashes or ulcerations, good bilateral pedal pulse.  Microfilament is normal.            Assessment & Plan:  DM  On Ace, statin,  Microfilament normal,  Advised not to go barefoot and tokeep up with diabetic eye exam.  I advised nutrition consult but she declines now and wishes to change diet on her own.  Will check AIC next visit.  Advised ot avoid sugary sodas.  Htn  Good control  Renal insufficiency  Will recheck creatinine today.  She has seen Dr. Rhae Lerner.  If creatinine higher , will need to consider stopping metformin  Hyperlipidemia     CHF   See me in 3 months

## 2012-04-22 NOTE — Patient Instructions (Addendum)
See me in 3 months  Labs will be mailed to you 

## 2012-04-23 ENCOUNTER — Encounter: Payer: Self-pay | Admitting: *Deleted

## 2012-04-23 LAB — BASIC METABOLIC PANEL
BUN: 23 mg/dL (ref 6–23)
CO2: 22 mEq/L (ref 19–32)
Calcium: 9.4 mg/dL (ref 8.4–10.5)
Chloride: 106 mEq/L (ref 96–112)
Creat: 1.29 mg/dL — ABNORMAL HIGH (ref 0.50–1.10)

## 2012-05-28 ENCOUNTER — Other Ambulatory Visit: Payer: Self-pay | Admitting: *Deleted

## 2012-06-02 ENCOUNTER — Other Ambulatory Visit: Payer: Self-pay | Admitting: Internal Medicine

## 2012-06-02 ENCOUNTER — Telehealth: Payer: Self-pay | Admitting: Internal Medicine

## 2012-06-02 MED ORDER — CARVEDILOL 12.5 MG PO TABS: 12.5000 mg | ORAL_TABLET | Freq: Two times a day (BID) | ORAL | Status: DC

## 2012-06-02 MED ORDER — AMLODIPINE BESY-BENAZEPRIL HCL 5-20 MG PO CAPS: 1.0000 | ORAL_CAPSULE | Freq: Every day | ORAL | Status: DC

## 2012-06-02 NOTE — Telephone Encounter (Signed)
Pt states that her Mail order pharmacy faxed over a request and they need the request fax back to them... She states if there are any questions please call her on her cell number [phone]... Thanks

## 2012-06-02 NOTE — Telephone Encounter (Signed)
Spoke with pt regarding Lotrel medication and renal insufficiency.  Will reduce Lotrel to once a day and discontinue bid dosing to avoid hyperkalemia.  Pt voices understanding to take Lotrel only once a day. Will check BP at October visit.

## 2012-06-03 MED ORDER — AMLODIPINE BESY-BENAZEPRIL HCL 5-20 MG PO CAPS: 1.0000 | ORAL_CAPSULE | Freq: Every day | ORAL | Status: DC

## 2012-06-03 MED ORDER — CARVEDILOL 12.5 MG PO TABS: 12.5000 mg | ORAL_TABLET | Freq: Two times a day (BID) | ORAL | Status: DC

## 2012-06-17 DIAGNOSIS — H47239 Glaucomatous optic atrophy, unspecified eye: Secondary | ICD-10-CM | POA: Diagnosis not present

## 2012-06-17 DIAGNOSIS — H409 Unspecified glaucoma: Secondary | ICD-10-CM | POA: Diagnosis not present

## 2012-07-15 DIAGNOSIS — H4011X0 Primary open-angle glaucoma, stage unspecified: Secondary | ICD-10-CM | POA: Diagnosis not present

## 2012-07-15 DIAGNOSIS — H2589 Other age-related cataract: Secondary | ICD-10-CM | POA: Diagnosis not present

## 2012-07-15 DIAGNOSIS — H04129 Dry eye syndrome of unspecified lacrimal gland: Secondary | ICD-10-CM | POA: Diagnosis not present

## 2012-07-15 DIAGNOSIS — E11329 Type 2 diabetes mellitus with mild nonproliferative diabetic retinopathy without macular edema: Secondary | ICD-10-CM | POA: Diagnosis not present

## 2012-07-22 ENCOUNTER — Encounter: Payer: Self-pay | Admitting: Internal Medicine

## 2012-07-22 ENCOUNTER — Ambulatory Visit (INDEPENDENT_AMBULATORY_CARE_PROVIDER_SITE_OTHER): Payer: Medicare Other | Admitting: Internal Medicine

## 2012-07-22 VITALS — BP 195/89 | HR 64 | Temp 97.1°F | Resp 20 | Wt 137.0 lb

## 2012-07-22 DIAGNOSIS — E139 Other specified diabetes mellitus without complications: Secondary | ICD-10-CM

## 2012-07-22 DIAGNOSIS — I10 Essential (primary) hypertension: Secondary | ICD-10-CM

## 2012-07-22 DIAGNOSIS — R7989 Other specified abnormal findings of blood chemistry: Secondary | ICD-10-CM

## 2012-07-22 DIAGNOSIS — E119 Type 2 diabetes mellitus without complications: Secondary | ICD-10-CM | POA: Diagnosis not present

## 2012-07-22 DIAGNOSIS — R799 Abnormal finding of blood chemistry, unspecified: Secondary | ICD-10-CM

## 2012-07-22 LAB — BASIC METABOLIC PANEL
BUN: 19 mg/dL (ref 6–23)
Creat: 1.04 mg/dL (ref 0.50–1.10)
Potassium: 4.3 mEq/L (ref 3.5–5.3)

## 2012-07-22 LAB — HEMOGLOBIN A1C
Hgb A1c MFr Bld: 6.3 % — ABNORMAL HIGH (ref <5.7)
Mean Plasma Glucose: 134 mg/dL — ABNORMAL HIGH (ref <117)

## 2012-07-22 MED ORDER — BENAZEPRIL HCL 20 MG PO TABS: 20.0000 mg | ORAL_TABLET | Freq: Every day | ORAL | Status: DC

## 2012-07-22 MED ORDER — AMLODIPINE BESYLATE 10 MG PO TABS: 10.0000 mg | ORAL_TABLET | Freq: Every day | ORAL | Status: DC

## 2012-07-22 NOTE — Patient Instructions (Addendum)
See me in 3 weeks  Take amlodipine 10 mg once daily and Benazepril 20 mg once daily with other meds

## 2012-07-22 NOTE — Progress Notes (Signed)
Subjective:    Patient ID: Brenda Spence, female    DOB: August 05, 1927, 78 y.o.   MRN: [id number]  HPI Shaunta is here for follow up  .  Her lotrel was reduced to 5/20 once a day secondary to her renal insufficiency  See  BP she is asymptomatic and did not take her BP meds this am.   No tingling of hands or feet  No Known Allergies Past Medical History  Diagnosis Date  . Post-menopausal   . CAD (coronary artery disease) 2006    PCI of RCA in [place], [place]  . Broken ribs   . CHF (congestive heart failure)   . Diabetes mellitus   . Hyperlipidemia   . Hypertension   . Arthritis   . Glaucoma    Past Surgical History  Procedure Date  . Breast surgery     fatty tissue  . Appendectomy   . Bladder surgery 1960  . Stent implant     heart   History   Social History  . Marital Status: Widowed    Spouse Name: N/A    Number of Children: 4  . Years of Education: N/A   Occupational History  . retired    Social History Main Topics  . Smoking status: Never Smoker   . Smokeless tobacco: Never Used  . Alcohol Use: No  . Drug Use: No  . Sexually Active: No   Other Topics Concern  . Not on file   Social History Narrative   ** Merged History Encounter **    Family History  Problem Relation Age of Onset  . Diabetes Sister   . Cancer Sister     stomach  . Stroke Brother   . Coronary artery disease Mother    Patient Active Problem List  Diagnosis  . Type II or unspecified type diabetes mellitus without mention of complication, uncontrolled  . CHF (congestive heart failure)  . Essential hypertension, benign  . Other and unspecified hyperlipidemia  . DJD (degenerative joint disease)  . CAD (coronary artery disease)  . Glaucoma  . Osteopenia  . Hypercalcemia  . Elevated serum creatinine  . Hydronephrosis of right kidney   Current Outpatient Prescriptions on File Prior to Visit  Medication Sig Dispense Refill  . aspirin 81 MG tablet Take 81 mg by mouth daily.      .  carvedilol (COREG) 12.5 MG tablet Take 1 tablet (12.5 mg total) by mouth 2 (two) times daily with a meal.  180 tablet  1  . furosemide (LASIX) 40 MG tablet Take 20 mg by mouth daily.       Marland Kitchen glyBURIDE-metformin (GLUCOVANCE) 5-500 MG per tablet Take 1 tablet by mouth 2 (two) times daily.      Marland Kitchen latanoprost (XALATAN) 0.005 % ophthalmic solution Place 1 drop into both eyes at bedtime.       Marland Kitchen amLODipine (NORVASC) 10 MG tablet Take 1 tablet (10 mg total) by mouth daily.  30 tablet  1  . benazepril (LOTENSIN) 20 MG tablet Take 1 tablet (20 mg total) by mouth daily.  30 tablet  1  . clopidogrel (PLAVIX) 75 MG tablet            Review of Systems See HPI    Objective:   Physical Exam  Physical Exam  Nursing note and vitals reviewed.  Constitutional: She is oriented to person, place, and time. She appears well-developed and well-nourished.  HENT:  Head: Normocephalic and atraumatic.  Cardiovascular: Normal rate and  regular rhythm. Exam reveals no gallop and no friction rub.  No murmur heard.  Pulmonary/Chest: Breath sounds normal. She has no wheezes. She has no rales.  Neurological: She is alert and oriented to person, place, and time.  Skin: Skin is warm and dry.  Psychiatric: She has a normal mood and affect. Her behavior is normal.  Ext:  No edema            Assessment & Plan:  HTN will change med to Norvasc 10 mg and keep benazepril to 20 mg given renal insufficency.    Renal insuff  Check labs today  Diabetes  Will check AIC today  See me in 3 weeks to  Check BP  Pt repeatedly declines flu vaccine despite my counsel that she needs it

## 2012-07-24 ENCOUNTER — Telehealth: Payer: Self-pay | Admitting: *Deleted

## 2012-07-24 ENCOUNTER — Ambulatory Visit: Payer: Medicare Other | Admitting: Internal Medicine

## 2012-07-24 NOTE — Telephone Encounter (Signed)
Lab results mailed to pt.

## 2012-07-31 DIAGNOSIS — E11329 Type 2 diabetes mellitus with mild nonproliferative diabetic retinopathy without macular edema: Secondary | ICD-10-CM | POA: Diagnosis not present

## 2012-07-31 DIAGNOSIS — H43819 Vitreous degeneration, unspecified eye: Secondary | ICD-10-CM | POA: Diagnosis not present

## 2012-07-31 DIAGNOSIS — H4011X0 Primary open-angle glaucoma, stage unspecified: Secondary | ICD-10-CM | POA: Diagnosis not present

## 2012-07-31 DIAGNOSIS — E1139 Type 2 diabetes mellitus with other diabetic ophthalmic complication: Secondary | ICD-10-CM | POA: Diagnosis not present

## 2012-08-01 ENCOUNTER — Ambulatory Visit (INDEPENDENT_AMBULATORY_CARE_PROVIDER_SITE_OTHER): Payer: Medicare Other | Admitting: *Deleted

## 2012-08-01 DIAGNOSIS — Z23 Encounter for immunization: Secondary | ICD-10-CM | POA: Diagnosis not present

## 2012-08-12 ENCOUNTER — Ambulatory Visit (INDEPENDENT_AMBULATORY_CARE_PROVIDER_SITE_OTHER): Payer: Medicare Other | Admitting: Internal Medicine

## 2012-08-12 ENCOUNTER — Encounter: Payer: Self-pay | Admitting: Internal Medicine

## 2012-08-12 VITALS — BP 142/82 | HR 69 | Temp 97.2°F | Resp 16 | Wt 141.0 lb

## 2012-08-12 DIAGNOSIS — R799 Abnormal finding of blood chemistry, unspecified: Secondary | ICD-10-CM | POA: Diagnosis not present

## 2012-08-12 DIAGNOSIS — I10 Essential (primary) hypertension: Secondary | ICD-10-CM | POA: Diagnosis not present

## 2012-08-12 DIAGNOSIS — R7989 Other specified abnormal findings of blood chemistry: Secondary | ICD-10-CM

## 2012-08-12 NOTE — Progress Notes (Signed)
Subjective:    Patient ID: Brenda Spence, female    DOB: [birth year], 78 y.o.   MRN: [id number]  HPI Brenda Spence is here for follow up after initiating Norvasc. She is tolerating well but states medicine tastes bad.  She brings outpt bp's and systolic ranges from 136-144 diastolics all normal.   She is asymptomatic  No Known Allergies Past Medical History  Diagnosis Date  . Post-menopausal   . CAD (coronary artery disease) 2006    PCI of RCA in [place], [place]  . Broken ribs   . CHF (congestive heart failure)   . Diabetes mellitus   . Hyperlipidemia   . Hypertension   . Arthritis   . Glaucoma    Past Surgical History  Procedure Date  . Breast surgery     fatty tissue  . Appendectomy   . Bladder surgery 1960  . Stent implant     heart   History   Social History  . Marital Status: Widowed    Spouse Name: N/A    Number of Children: 4  . Years of Education: N/A   Occupational History  . retired    Social History Main Topics  . Smoking status: Never Smoker   . Smokeless tobacco: Never Used  . Alcohol Use: No  . Drug Use: No  . Sexually Active: No   Other Topics Concern  . Not on file   Social History Narrative   ** Merged History Encounter **    Family History  Problem Relation Age of Onset  . Diabetes Sister   . Cancer Sister     stomach  . Stroke Brother   . Coronary artery disease Mother    Patient Active Problem List  Diagnosis  . Type II or unspecified type diabetes mellitus without mention of complication, uncontrolled  . CHF (congestive heart failure)  . Essential hypertension, benign  . Other and unspecified hyperlipidemia  . DJD (degenerative joint disease)  . CAD (coronary artery disease)  . Glaucoma  . Osteopenia  . Hypercalcemia  . Elevated serum creatinine  . Hydronephrosis of right kidney   Current Outpatient Prescriptions on File Prior to Visit  Medication Sig Dispense Refill  . amLODipine (NORVASC) 10 MG tablet Take 1 tablet (10 mg  total) by mouth daily.  30 tablet  1  . aspirin 81 MG tablet Take 81 mg by mouth daily.      . benazepril (LOTENSIN) 20 MG tablet Take 1 tablet (20 mg total) by mouth daily.  30 tablet  1  . carvedilol (COREG) 12.5 MG tablet Take 1 tablet (12.5 mg total) by mouth 2 (two) times daily with a meal.  180 tablet  1  . clopidogrel (PLAVIX) 75 MG tablet       . furosemide (LASIX) 40 MG tablet Take 20 mg by mouth daily.       Marland Kitchen glyBURIDE-metformin (GLUCOVANCE) 5-500 MG per tablet Take 1 tablet by mouth 2 (two) times daily.      Marland Kitchen latanoprost (XALATAN) 0.005 % ophthalmic solution Place 1 drop into both eyes at bedtime.           Review of Systems See HPI    Objective:   Physical Exam  Physical Exam  Nursing note and vitals reviewed.  Constitutional: She is oriented to person, place, and time. She appears well-developed and well-nourished.  HENT:  Head: Normocephalic and atraumatic.  Cardiovascular: Normal rate and regular rhythm. Exam reveals no gallop and no friction rub.  No  murmur heard.  Pulmonary/Chest: Breath sounds normal. She has no wheezes. She has no rales.  Neurological: She is alert and oriented to person, place, and time.  Skin: Skin is warm and dry.  Psychiatric: She has a normal mood and affect. Her behavior is normal.  Ext no edema           Assessment & Plan:  HTN   Improved.  Continue 4 meds  DM   Good control  Renal insufficiency  Last creatinine normal  See me as needed

## 2012-08-12 NOTE — Patient Instructions (Addendum)
See me as needed 

## 2012-09-01 ENCOUNTER — Other Ambulatory Visit: Payer: Self-pay | Admitting: Internal Medicine

## 2012-09-01 NOTE — Telephone Encounter (Signed)
Needs refill

## 2012-09-01 NOTE — Telephone Encounter (Signed)
Pt needs refill of Furosemide (Tab) LASIX 40 MG Take 20 mg by mouth daily.  She does not has any left... Please send to the [facility] [place].Marland KitchenMarland Kitchen

## 2012-09-02 MED ORDER — FUROSEMIDE 40 MG PO TABS: 20.0000 mg | ORAL_TABLET | Freq: Every day | ORAL | Status: DC

## 2012-09-15 ENCOUNTER — Telehealth: Payer: Self-pay | Admitting: Internal Medicine

## 2012-09-15 NOTE — Telephone Encounter (Signed)
Pt is concern about her blood pressure.. Per pt it has been high... This morning after 8am it was 149/103... Please call pt at [phone].Marland KitchenMarland Kitchen

## 2012-09-15 NOTE — Telephone Encounter (Signed)
Brenda Spence  Call pt and if she needs to be added to schedule add her in.  Counsel her to be patient as she will be added in

## 2012-09-22 ENCOUNTER — Telehealth: Payer: Self-pay | Admitting: Internal Medicine

## 2012-09-22 NOTE — Telephone Encounter (Signed)
PT states she needs a call back because there are prescriptions that need to be refill and need Dr. Reece Levy authorization... Pt states her blood pressure is 158/109 this morning... Please call home number but also try the cell number as well... Thanks

## 2012-09-23 ENCOUNTER — Ambulatory Visit (INDEPENDENT_AMBULATORY_CARE_PROVIDER_SITE_OTHER): Payer: Medicare Other | Admitting: Internal Medicine

## 2012-09-23 ENCOUNTER — Telehealth: Payer: Self-pay | Admitting: Internal Medicine

## 2012-09-23 ENCOUNTER — Encounter: Payer: Self-pay | Admitting: Internal Medicine

## 2012-09-23 VITALS — BP 148/78 | HR 61 | Temp 97.2°F | Resp 18 | Wt 139.0 lb

## 2012-09-23 DIAGNOSIS — I10 Essential (primary) hypertension: Secondary | ICD-10-CM | POA: Diagnosis not present

## 2012-09-23 DIAGNOSIS — I509 Heart failure, unspecified: Secondary | ICD-10-CM | POA: Diagnosis not present

## 2012-09-23 MED ORDER — AMLODIPINE BESYLATE 10 MG PO TABS: 10.0000 mg | ORAL_TABLET | Freq: Every day | ORAL | Status: DC

## 2012-09-23 MED ORDER — CARVEDILOL 12.5 MG PO TABS: 12.5000 mg | ORAL_TABLET | Freq: Two times a day (BID) | ORAL | Status: DC

## 2012-09-23 MED ORDER — BENAZEPRIL HCL 20 MG PO TABS: 20.0000 mg | ORAL_TABLET | Freq: Every day | ORAL | Status: DC

## 2012-09-23 MED ORDER — FUROSEMIDE 40 MG PO TABS: 20.0000 mg | ORAL_TABLET | Freq: Every day | ORAL | Status: DC

## 2012-09-23 NOTE — Progress Notes (Signed)
Subjective:    Patient ID: Brenda Spence, female    DOB: [birth year], 78 y.o.   MRN: [id number]  HPI  Brenda Spence is here with a concern about her BP at home.  She checks it every morning but forgot to bring her recordings.  She is asymptomatic.  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  She has not had her Norvasc in a couple of days  She has no headache no chest pain or pressure No dizziness.  No Known Allergies Past Medical History  Diagnosis Date  . Post-menopausal   . CAD (coronary artery disease) 2006    PCI of RCA in [place], [place]  . Broken ribs   . CHF (congestive heart failure)   . Diabetes mellitus   . Hyperlipidemia   . Hypertension   . Arthritis   . Glaucoma    Past Surgical History  Procedure Date  . Breast surgery     fatty tissue  . Appendectomy   . Bladder surgery 1960  . Stent implant     heart   History   Social History  . Marital Status: Widowed    Spouse Name: N/A    Number of Children: 4  . Years of Education: N/A   Occupational History  . retired    Social History Main Topics  . Smoking status: Never Smoker   . Smokeless tobacco: Never Used  . Alcohol Use: No  . Drug Use: No  . Sexually Active: No   Other Topics Concern  . Not on file   Social History Narrative   ** Merged History Encounter **    Family History  Problem Relation Age of Onset  . Diabetes Sister   . Cancer Sister     stomach  . Stroke Brother   . Coronary artery disease Mother    Patient Active Problem List  Diagnosis  . Type II or unspecified type diabetes mellitus without mention of complication, uncontrolled  . CHF (congestive heart failure)  . Essential hypertension, benign  . Other and unspecified hyperlipidemia  . DJD (degenerative joint disease)  . CAD (coronary artery disease)  . Glaucoma  . Osteopenia  . Hypercalcemia  . Elevated serum creatinine  . Hydronephrosis of right kidney   Current Outpatient Prescriptions on  File Prior to Visit  Medication Sig Dispense Refill  . aspirin 81 MG tablet Take 81 mg by mouth daily.      . clopidogrel (PLAVIX) 75 MG tablet       . glyBURIDE-metformin (GLUCOVANCE) 5-500 MG per tablet Take 1 tablet by mouth 2 (two) times daily.      Marland Kitchen latanoprost (XALATAN) 0.005 % ophthalmic solution Place 1 drop into both eyes at bedtime.       . [DISCONTINUED] amLODipine (NORVASC) 10 MG tablet Take 1 tablet (10 mg total) by mouth daily.  30 tablet  1  . [DISCONTINUED] benazepril (LOTENSIN) 20 MG tablet Take 1 tablet (20 mg total) by mouth daily.  30 tablet  1  . [DISCONTINUED] carvedilol (COREG) 12.5 MG tablet Take 1 tablet (12.5 mg total) by mouth 2 (two) times daily with a meal.  180 tablet  1  . [DISCONTINUED] furosemide (LASIX) 40 MG tablet Take 0.5 tablets (20 mg total) by mouth daily. Takes 1/2 daily  30 tablet  1      Review of Systems See HPI    Objective:   Physical Exam  Physical Exam  Nursing note and  vitals reviewed.  Constitutional: She is oriented to person, place, and time. She appears well-developed and well-nourished.  HENT:  Head: Normocephalic and atraumatic.  Cardiovascular: Normal rate and regular rhythm. Exam reveals no gallop and no friction rub.  No murmur heard.  Pulmonary/Chest: Breath sounds normal. She has no wheezes. She has no rales.  Neurological: She is alert and oriented to person, place, and time.  Skin: Skin is warm and dry.  Psychiatric: She has a normal mood and affect. Her behavior is normal.  Ext:  No edema           Assessment & Plan:  HTN:  Pt appears unsure of meds she has taken  I refilled following meds for control of her BP  Lasix 40 mg 1/2 tab daily Norvasc 10 mg daily Lotensin 20 mg daily Coreg 12.5 mg one bid  I lowered her Lotensin to 20 mg instead of 40 daily dose as her creatinine was elevated  I asked her to check BP once a week  She is to see me in 3-4 weeks

## 2012-09-23 NOTE — Telephone Encounter (Signed)
[address] [facility]  Call [facility] office and see if pt went to see Dr. Camille Bal .  She is a neprologist.  I need any office not they may have.

## 2012-09-23 NOTE — Patient Instructions (Addendum)
See me in mid January

## 2012-09-24 ENCOUNTER — Telehealth: Payer: Self-pay | Admitting: *Deleted

## 2012-09-26 NOTE — Telephone Encounter (Signed)
Verified with pharmacy regarding RX amlodipine

## 2012-09-30 ENCOUNTER — Encounter: Payer: Self-pay | Admitting: *Deleted

## 2012-10-22 DIAGNOSIS — I10 Essential (primary) hypertension: Secondary | ICD-10-CM | POA: Diagnosis not present

## 2012-10-22 DIAGNOSIS — N390 Urinary tract infection, site not specified: Secondary | ICD-10-CM | POA: Diagnosis not present

## 2012-10-22 DIAGNOSIS — I129 Hypertensive chronic kidney disease with stage 1 through stage 4 chronic kidney disease, or unspecified chronic kidney disease: Secondary | ICD-10-CM | POA: Diagnosis not present

## 2012-10-22 DIAGNOSIS — R809 Proteinuria, unspecified: Secondary | ICD-10-CM | POA: Diagnosis not present

## 2012-10-28 ENCOUNTER — Encounter: Payer: Self-pay | Admitting: Internal Medicine

## 2012-10-28 ENCOUNTER — Ambulatory Visit (INDEPENDENT_AMBULATORY_CARE_PROVIDER_SITE_OTHER): Payer: Medicare Other | Admitting: Internal Medicine

## 2012-10-28 VITALS — BP 136/78 | HR 71 | Temp 97.1°F | Resp 16 | Wt 139.0 lb

## 2012-10-28 DIAGNOSIS — I10 Essential (primary) hypertension: Secondary | ICD-10-CM

## 2012-10-28 DIAGNOSIS — R7989 Other specified abnormal findings of blood chemistry: Secondary | ICD-10-CM

## 2012-10-28 DIAGNOSIS — R799 Abnormal finding of blood chemistry, unspecified: Secondary | ICD-10-CM | POA: Diagnosis not present

## 2012-10-28 NOTE — Progress Notes (Signed)
Subjective:    Patient ID: Brenda Spence, female    DOB: [birth year], 79 y.o.   MRN: [id number]  HPI  Brenda Spence  Is here for follow up of her BP.  She is now back on her lotrel, amlodipine, coreg and Lasix.  She states SBP at home running 130's to 140's    She did have an appt.  with Dr. Eliott Nine recently and pt reports kidney function stable.  She has been taking a mail order herbal supplement that is marketed to help with blood glucose.  She brings bottle and it has cinnamon and lots of 7-8 other herbs.  No Known Allergies Past Medical History  Diagnosis Date  . Post-menopausal   . CAD (coronary artery disease) 2006    PCI of RCA in [place], [place]  . Broken ribs   . CHF (congestive heart failure)   . Diabetes mellitus   . Hyperlipidemia   . Hypertension   . Arthritis   . Glaucoma    Past Surgical History  Procedure Date  . Breast surgery     fatty tissue  . Appendectomy   . Bladder surgery 1960  . Stent implant     heart   History   Social History  . Marital Status: Widowed    Spouse Name: N/A    Number of Children: 4  . Years of Education: N/A   Occupational History  . retired    Social History Main Topics  . Smoking status: Never Smoker   . Smokeless tobacco: Never Used  . Alcohol Use: No  . Drug Use: No  . Sexually Active: No   Other Topics Concern  . Not on file   Social History Narrative   ** Merged History Encounter **    Family History  Problem Relation Age of Onset  . Diabetes Sister   . Cancer Sister     stomach  . Stroke Brother   . Coronary artery disease Mother    Patient Active Problem List  Diagnosis  . Type II or unspecified type diabetes mellitus without mention of complication, uncontrolled  . CHF (congestive heart failure)  . Essential hypertension, benign  . Other and unspecified hyperlipidemia  . DJD (degenerative joint disease)  . CAD (coronary artery disease)  . Glaucoma  . Osteopenia  . Hypercalcemia  . Elevated serum  creatinine  . Hydronephrosis of right kidney   Current Outpatient Prescriptions on File Prior to Visit  Medication Sig Dispense Refill  . amLODipine (NORVASC) 10 MG tablet Take 1 tablet (10 mg total) by mouth daily.  90 tablet  1  . aspirin 81 MG tablet Take 81 mg by mouth daily.      . benazepril (LOTENSIN) 20 MG tablet Take 1 tablet (20 mg total) by mouth daily.  90 tablet  2  . furosemide (LASIX) 40 MG tablet Take 0.5 tablets (20 mg total) by mouth daily. Takes 1/2 daily  45 tablet  1  . latanoprost (XALATAN) 0.005 % ophthalmic solution Place 1 drop into both eyes at bedtime.       . carvedilol (COREG) 12.5 MG tablet Take 1 tablet (12.5 mg total) by mouth 2 (two) times daily with a meal.  180 tablet  1  . clopidogrel (PLAVIX) 75 MG tablet       . glyBURIDE-metformin (GLUCOVANCE) 5-500 MG per tablet Take 1 tablet by mouth 2 (two) times daily.          Review of Systems    see  HPI Objective:   Physical Exam  Physical Exam  Nursing note and vitals reviewed.   See repeat BP my exam Constitutional: She is oriented to person, place, and time. She appears well-developed and well-nourished.  HENT:  Head: Normocephalic and atraumatic.  Cardiovascular: Normal rate and regular rhythm. Exam reveals no gallop and no friction rub.  No murmur heard.  Pulmonary/Chest: Breath sounds normal. She has no wheezes. She has no rales.  Neurological: She is alert and oriented to person, place, and time.  Skin: Skin is warm and dry.  Psychiatric: She has a normal mood and affect. Her behavior is normal.  Ext:  No edema      Assessment & Plan:  HTN:  Adequate control for now  Continue  Norvasc 10 mg,  Coreg 12.5 mg bid, lasix 40 mg daily.  Renal insufficiency:  She is seeing nephrologist q65months.    I counseled pt that I cannot recommend OTC herbal supplements and that there is no guarantee that herbs will harm her liver or kidneys.  She will see me in two months and I will check chemistries at  that time.  She voices understanding

## 2012-10-28 NOTE — Patient Instructions (Addendum)
See me in 2 months 

## 2012-12-04 DIAGNOSIS — E11329 Type 2 diabetes mellitus with mild nonproliferative diabetic retinopathy without macular edema: Secondary | ICD-10-CM | POA: Diagnosis not present

## 2012-12-04 DIAGNOSIS — E1139 Type 2 diabetes mellitus with other diabetic ophthalmic complication: Secondary | ICD-10-CM | POA: Diagnosis not present

## 2012-12-04 DIAGNOSIS — H4011X0 Primary open-angle glaucoma, stage unspecified: Secondary | ICD-10-CM | POA: Diagnosis not present

## 2012-12-16 DIAGNOSIS — H2510 Age-related nuclear cataract, unspecified eye: Secondary | ICD-10-CM | POA: Diagnosis not present

## 2012-12-16 DIAGNOSIS — E1139 Type 2 diabetes mellitus with other diabetic ophthalmic complication: Secondary | ICD-10-CM | POA: Diagnosis not present

## 2012-12-16 DIAGNOSIS — H4011X0 Primary open-angle glaucoma, stage unspecified: Secondary | ICD-10-CM | POA: Diagnosis not present

## 2012-12-25 ENCOUNTER — Ambulatory Visit: Payer: Medicare Other | Admitting: Internal Medicine

## 2013-01-13 DIAGNOSIS — K760 Fatty (change of) liver, not elsewhere classified: Secondary | ICD-10-CM

## 2013-01-13 HISTORY — DX: Fatty (change of) liver, not elsewhere classified: K76.0

## 2013-01-14 ENCOUNTER — Telehealth: Payer: Self-pay | Admitting: *Deleted

## 2013-01-14 NOTE — Telephone Encounter (Signed)
appt for F/u made

## 2013-01-15 ENCOUNTER — Other Ambulatory Visit: Payer: Self-pay | Admitting: Internal Medicine

## 2013-01-15 ENCOUNTER — Encounter: Payer: Self-pay | Admitting: Internal Medicine

## 2013-01-15 ENCOUNTER — Ambulatory Visit (INDEPENDENT_AMBULATORY_CARE_PROVIDER_SITE_OTHER): Payer: Medicare Other | Admitting: Internal Medicine

## 2013-01-15 VITALS — BP 166/80 | HR 62 | Temp 97.0°F | Resp 18 | Wt 137.0 lb

## 2013-01-15 DIAGNOSIS — R1032 Left lower quadrant pain: Secondary | ICD-10-CM

## 2013-01-15 DIAGNOSIS — Z8719 Personal history of other diseases of the digestive system: Secondary | ICD-10-CM

## 2013-01-15 DIAGNOSIS — I10 Essential (primary) hypertension: Secondary | ICD-10-CM | POA: Diagnosis not present

## 2013-01-15 DIAGNOSIS — Z1231 Encounter for screening mammogram for malignant neoplasm of breast: Secondary | ICD-10-CM

## 2013-01-15 LAB — CBC WITH DIFFERENTIAL/PLATELET
Basophils Absolute: 0 10*3/uL (ref 0.0–0.1)
Basophils Relative: 1 % (ref 0–1)
MCHC: 33.5 g/dL (ref 30.0–36.0)
Neutro Abs: 3.1 10*3/uL (ref 1.7–7.7)
Neutrophils Relative %: 52 % (ref 43–77)
RDW: 12.6 % (ref 11.5–15.5)
WBC: 5.9 10*3/uL (ref 4.0–10.5)

## 2013-01-15 LAB — COMPREHENSIVE METABOLIC PANEL
ALT: 15 U/L (ref 0–35)
AST: 20 U/L (ref 0–37)
Albumin: 4.7 g/dL (ref 3.5–5.2)
Alkaline Phosphatase: 74 U/L (ref 39–117)
Potassium: 4.4 mEq/L (ref 3.5–5.3)
Sodium: 139 mEq/L (ref 135–145)
Total Protein: 7.5 g/dL (ref 6.0–8.3)

## 2013-01-15 NOTE — Progress Notes (Signed)
Subjective:    Patient ID: Brenda Spence, female    DOB: Jul 16, 1927, 79 y.o.   MRN: [id number]  HPI  Brenda Spence is here for follow up.  NO paresthesia in feet.  She brings copy of glucoses and glucoses good except at bedftime they can be over 200 FBS great highest in 160.  She does drink a glass of OJ at bedtime as she is afraid her sugar will get too lowovernight  She has been having sevral days of LLQ pain associated with cramps and diarrhea.  She does have a history of diverticulitis in the past.  NO fever no frank blood or black stools  No N/V appetitie oK  No Known Allergies Past Medical History  Diagnosis Date  . Post-menopausal   . CAD (coronary artery disease) 2006    PCI of RCA in [place], [place]  . Broken ribs   . CHF (congestive heart failure)   . Diabetes mellitus   . Hyperlipidemia   . Hypertension   . Arthritis   . Glaucoma    Past Surgical History  Procedure Laterality Date  . Breast surgery      fatty tissue  . Appendectomy    . Bladder surgery  1960  . Stent implant      heart   History   Social History  . Marital Status: Widowed    Spouse Name: N/A    Number of Children: 4  . Years of Education: N/A   Occupational History  . retired    Social History Main Topics  . Smoking status: Never Smoker   . Smokeless tobacco: Never Used  . Alcohol Use: No  . Drug Use: No  . Sexually Active: No   Other Topics Concern  . Not on file   Social History Narrative   ** Merged History Encounter **       Family History  Problem Relation Age of Onset  . Diabetes Sister   . Cancer Sister     stomach  . Stroke Brother   . Coronary artery disease Mother    Patient Active Problem List  Diagnosis  . Type II or unspecified type diabetes mellitus without mention of complication, uncontrolled  . CHF (congestive heart failure)  . Essential hypertension, benign  . Other and unspecified hyperlipidemia  . DJD (degenerative joint disease)  . CAD (coronary artery  disease)  . Glaucoma  . Osteopenia  . Hypercalcemia  . Elevated serum creatinine  . Hydronephrosis of right kidney   Current Outpatient Prescriptions on File Prior to Visit  Medication Sig Dispense Refill  . amLODipine (NORVASC) 10 MG tablet Take 1 tablet (10 mg total) by mouth daily.  90 tablet  1  . aspirin 81 MG tablet Take 81 mg by mouth daily.      . benazepril (LOTENSIN) 20 MG tablet Take 1 tablet (20 mg total) by mouth daily.  90 tablet  2  . carvedilol (COREG) 12.5 MG tablet Take 1 tablet (12.5 mg total) by mouth 2 (two) times daily with a meal.  180 tablet  1  . clopidogrel (PLAVIX) 75 MG tablet       . furosemide (LASIX) 40 MG tablet Take 0.5 tablets (20 mg total) by mouth daily. Takes 1/2 daily  45 tablet  1  . glyBURIDE-metformin (GLUCOVANCE) 5-500 MG per tablet Take 1 tablet by mouth 2 (two) times daily.      Marland Kitchen latanoprost (XALATAN) 0.005 % ophthalmic solution Place 1 drop into both eyes at  bedtime.        No current facility-administered medications on file prior to visit.      Review of Systems    see HPI Objective:   Physical Exam Physical Exam  Nursing note and vitals reviewed.  Constitutional: She is oriented to person, place, and time. She appears well-developed and well-nourished.  HENT:  Head: Normocephalic and atraumatic.  Cardiovascular: Normal rate and regular rhythm. Exam reveals no gallop and no friction rub.  No murmur heard.  Pulmonary/Chest: Breath sounds normal. She has no wheezes. She has no rales. Abd soft NT/ND no peritoneal signs.  No rebound. Pain to deep palpation LLQ  Rectal brown stool guaiac neg  Neurological: She is alert and oriented to person, place, and time.  Skin: Skin is warm and dry.  Psychiatric: She has a normal mood and affect. Her behavior is normal.  Foot exam  Normal microfilament good pedal pulses  No rash or ulcerations            Assessment & Plan:  Diabetes  Check AIC today  Foot exam normal  On ACE  Advised  to not have fruit juice at night to have protein snack  LLQ history of diverticulitis  Check labs today   Will schedule  CT for am  HTN  BP normal at home per pt report  See me Monday in office

## 2013-01-16 ENCOUNTER — Ambulatory Visit (HOSPITAL_BASED_OUTPATIENT_CLINIC_OR_DEPARTMENT_OTHER)
Admission: RE | Admit: 2013-01-16 | Discharge: 2013-01-16 | Disposition: A | Discharge status: Home / Self Care | Payer: Medicare Other | Source: Ambulatory Visit | Attending: Internal Medicine | Admitting: Internal Medicine

## 2013-01-16 ENCOUNTER — Telehealth: Payer: Self-pay | Admitting: Internal Medicine

## 2013-01-16 DIAGNOSIS — R52 Pain, unspecified: Secondary | ICD-10-CM | POA: Insufficient documentation

## 2013-01-16 DIAGNOSIS — N289 Disorder of kidney and ureter, unspecified: Secondary | ICD-10-CM | POA: Diagnosis not present

## 2013-01-16 DIAGNOSIS — Z1231 Encounter for screening mammogram for malignant neoplasm of breast: Secondary | ICD-10-CM

## 2013-01-16 DIAGNOSIS — R1032 Left lower quadrant pain: Secondary | ICD-10-CM | POA: Insufficient documentation

## 2013-01-16 MED ORDER — IOHEXOL 300 MG/ML  SOLN
100.0000 mL | Freq: Once | INTRAMUSCULAR | Status: AC | PRN
  Administered 2013-01-16: 100 mL via INTRAVENOUS

## 2013-01-16 NOTE — Telephone Encounter (Signed)
Just FYI: [facility] call to state that they finish the CT with pt and the report is in the system.Marland KitchenMarland Kitchen

## 2013-01-19 ENCOUNTER — Encounter: Payer: Self-pay | Admitting: *Deleted

## 2013-01-19 ENCOUNTER — Telehealth: Payer: Self-pay | Admitting: Internal Medicine

## 2013-01-19 ENCOUNTER — Encounter: Payer: Self-pay | Admitting: Internal Medicine

## 2013-01-19 ENCOUNTER — Ambulatory Visit (INDEPENDENT_AMBULATORY_CARE_PROVIDER_SITE_OTHER): Payer: Medicare Other | Admitting: Internal Medicine

## 2013-01-19 VITALS — BP 176/80 | HR 72 | Temp 97.0°F | Resp 18 | Wt 137.0 lb

## 2013-01-19 DIAGNOSIS — L989 Disorder of the skin and subcutaneous tissue, unspecified: Secondary | ICD-10-CM | POA: Diagnosis not present

## 2013-01-19 DIAGNOSIS — E119 Type 2 diabetes mellitus without complications: Secondary | ICD-10-CM | POA: Diagnosis not present

## 2013-01-19 DIAGNOSIS — R197 Diarrhea, unspecified: Secondary | ICD-10-CM

## 2013-01-19 DIAGNOSIS — I10 Essential (primary) hypertension: Secondary | ICD-10-CM | POA: Diagnosis not present

## 2013-01-19 LAB — HEMOGLOBIN A1C: Mean Plasma Glucose: 137 mg/dL — ABNORMAL HIGH (ref <117)

## 2013-01-19 NOTE — Patient Instructions (Addendum)
For blood pressure medicine  :  Carvedilol (Coreg)  Take two tablets in the morning and one in evening  For constipation  Use Miralax  3 times if needed

## 2013-01-19 NOTE — Telephone Encounter (Signed)
Pt will see Dr. Sharyn Lull, Dermatology Specialist, on 04.11.14 at 920 am...arrival time 9 am... Pt made aware thru cell number, but she states she will come back to the office to receive our referral sheet with date, time, location, and number... Ad

## 2013-01-19 NOTE — Progress Notes (Signed)
Subjective:    Patient ID: Brenda Spence, female    DOB: [birth year], 79 y.o.   MRN: [id number]  HPI  Brenda Spence is here for follow up.   She states diarrhea is improved.   She has no further abd pain.    See BP  Not at goal  See CT   Fatty liver and slightly enlarged CBD but she has normal LFTs.    She has large darkened skin lesion on her R arm.  She tells me her son (a physician)  Had removed it "a couple of times"  But it keeps coming back  No Known Allergies Past Medical History  Diagnosis Date  . Post-menopausal   . CAD (coronary artery disease) 2006    PCI of RCA in [place], [place]  . Broken ribs   . CHF (congestive heart failure)   . Diabetes mellitus   . Hyperlipidemia   . Hypertension   . Arthritis   . Glaucoma    Past Surgical History  Procedure Laterality Date  . Breast surgery      fatty tissue  . Appendectomy    . Bladder surgery  1960  . Stent implant      heart   History   Social History  . Marital Status: Widowed    Spouse Name: N/A    Number of Children: 4  . Years of Education: N/A   Occupational History  . retired    Social History Main Topics  . Smoking status: Never Smoker   . Smokeless tobacco: Never Used  . Alcohol Use: No  . Drug Use: No  . Sexually Active: No   Other Topics Concern  . Not on file   Social History Narrative   ** Merged History Encounter **       Family History  Problem Relation Age of Onset  . Diabetes Sister   . Cancer Sister     stomach  . Stroke Brother   . Coronary artery disease Mother    Patient Active Problem List  Diagnosis  . Type II or unspecified type diabetes mellitus without mention of complication, uncontrolled  . CHF (congestive heart failure)  . Essential hypertension, benign  . Other and unspecified hyperlipidemia  . DJD (degenerative joint disease)  . CAD (coronary artery disease)  . Glaucoma  . Osteopenia  . Hypercalcemia  . Elevated serum creatinine  . Hydronephrosis of right kidney   . History of diverticulosis   Current Outpatient Prescriptions on File Prior to Visit  Medication Sig Dispense Refill  . amLODipine (NORVASC) 10 MG tablet Take 1 tablet (10 mg total) by mouth daily.  90 tablet  1  . aspirin 81 MG tablet Take 81 mg by mouth daily.      . benazepril (LOTENSIN) 20 MG tablet Take 1 tablet (20 mg total) by mouth daily.  90 tablet  2  . carvedilol (COREG) 12.5 MG tablet Take 1 tablet (12.5 mg total) by mouth 2 (two) times daily with a meal.  180 tablet  1  . furosemide (LASIX) 40 MG tablet Take 0.5 tablets (20 mg total) by mouth daily. Takes 1/2 daily  45 tablet  1  . glyBURIDE-metformin (GLUCOVANCE) 5-500 MG per tablet Take 1 tablet by mouth 2 (two) times daily.      Marland Kitchen latanoprost (XALATAN) 0.005 % ophthalmic solution Place 1 drop into both eyes at bedtime.       . clopidogrel (PLAVIX) 75 MG tablet  No current facility-administered medications on file prior to visit.      Review of Systems See HPI    Objective:   Physical Exam Physical Exam  Nursing note and vitals reviewed.  Constitutional: She is oriented to person, place, and time. She appears well-developed and well-nourished.  HENT:  Head: Normocephalic and atraumatic.  Cardiovascular: Normal rate and regular rhythm. Exam reveals no gallop and no friction rub.  No murmur heard.  Pulmonary/Chest: Breath sounds normal. She has no wheezes. She has no rales.  Abd  Soft nt nd  No HSM Neurological: She is alert and oriented to person, place, and time.  Skin: Skin is warm and dry.   Approx 1.5 cm darkened papular scaly lesion R upper arm Psychiatric: She has a normal mood and affect. Her behavior is normal.        Assessment & Plan:  Diarrhea   Improving  No  abd pain  HTN  Pt counseled to take coreg 12.5 mg two in am and one in evening.  Written instrcutions given  Skin lesion  Will refer to Dermatology  Dr. Sharyn Lull  Diabetes  Will check [facility] today   See me in 3 months

## 2013-01-21 ENCOUNTER — Encounter: Payer: Self-pay | Admitting: *Deleted

## 2013-03-04 ENCOUNTER — Other Ambulatory Visit: Payer: Self-pay | Admitting: *Deleted

## 2013-03-04 MED ORDER — GLYBURIDE-METFORMIN 5-500 MG PO TABS: 1.0000 | ORAL_TABLET | Freq: Two times a day (BID) | ORAL | Status: DC

## 2013-03-04 NOTE — Telephone Encounter (Signed)
Refill request

## 2013-03-14 ENCOUNTER — Other Ambulatory Visit: Payer: Self-pay | Admitting: Internal Medicine

## 2013-03-16 NOTE — Telephone Encounter (Signed)
Refill request

## 2013-04-04 ENCOUNTER — Other Ambulatory Visit: Payer: Self-pay | Admitting: Internal Medicine

## 2013-04-06 NOTE — Telephone Encounter (Signed)
refill 

## 2013-04-23 ENCOUNTER — Other Ambulatory Visit: Payer: Self-pay

## 2013-04-27 ENCOUNTER — Encounter: Payer: Self-pay | Admitting: Internal Medicine

## 2013-04-27 ENCOUNTER — Encounter: Payer: Self-pay | Admitting: *Deleted

## 2013-04-27 ENCOUNTER — Other Ambulatory Visit: Payer: Self-pay | Admitting: *Deleted

## 2013-04-27 ENCOUNTER — Ambulatory Visit (INDEPENDENT_AMBULATORY_CARE_PROVIDER_SITE_OTHER): Payer: Medicare Other | Admitting: Internal Medicine

## 2013-04-27 VITALS — BP 161/72 | HR 60 | Temp 97.4°F | Resp 18 | Wt 137.0 lb

## 2013-04-27 DIAGNOSIS — IMO0001 Reserved for inherently not codable concepts without codable children: Secondary | ICD-10-CM

## 2013-04-27 DIAGNOSIS — I10 Essential (primary) hypertension: Secondary | ICD-10-CM | POA: Diagnosis not present

## 2013-04-27 DIAGNOSIS — R799 Abnormal finding of blood chemistry, unspecified: Secondary | ICD-10-CM

## 2013-04-27 DIAGNOSIS — R7989 Other specified abnormal findings of blood chemistry: Secondary | ICD-10-CM

## 2013-04-27 MED ORDER — CARVEDILOL 12.5 MG PO TABS: ORAL_TABLET | ORAL | Status: DC

## 2013-04-27 MED ORDER — BENAZEPRIL HCL 20 MG PO TABS: 20.0000 mg | ORAL_TABLET | Freq: Every day | ORAL | Status: DC

## 2013-04-27 NOTE — Telephone Encounter (Signed)
Pharmacy called stating patient is requesting a 90 Rx instead of the 30 day that she was given. If ok please send new Rx to [facility] pharmacy.

## 2013-04-27 NOTE — Progress Notes (Signed)
Subjective:    Patient ID: Brenda Spence, female    DOB: April 02, 1927, 79 y.o.   MRN: [id number]  HPI Brenda Spence is here for her diabetes check.  Overall doing well .Marland Kitchen  She will be travelling to see her son in [place] this October  See BP .  I advised pt to increase her Carvedilol to 12.5 mg two tablets in am and one 12.5 mg tablet in pm.  She has only been taking one bid. She does have an upcoming appt with her nephrologist  No Known Allergies Past Medical History  Diagnosis Date  . Post-menopausal   . CAD (coronary artery disease) 2006    PCI of RCA in [place], [place]  . Broken ribs   . CHF (congestive heart failure)   . Diabetes mellitus   . Hyperlipidemia   . Hypertension   . Arthritis   . Glaucoma    Past Surgical History  Procedure Laterality Date  . Breast surgery      fatty tissue  . Appendectomy    . Bladder surgery  1960  . Stent implant      heart   History   Social History  . Marital Status: Widowed    Spouse Name: N/A    Number of Children: 4  . Years of Education: N/A   Occupational History  . retired    Social History Main Topics  . Smoking status: Never Smoker   . Smokeless tobacco: Never Used  . Alcohol Use: No  . Drug Use: No  . Sexually Active: No   Other Topics Concern  . Not on file   Social History Narrative   ** Merged History Encounter **       Family History  Problem Relation Age of Onset  . Diabetes Sister   . Cancer Sister     stomach  . Stroke Brother   . Coronary artery disease Mother    Patient Active Problem List   Diagnosis Date Noted  . History of diverticulosis 01/15/2013  . Hydronephrosis of right kidney 04/06/2012  . Hypercalcemia 03/04/2012  . Elevated serum creatinine 03/04/2012  . Type II or unspecified type diabetes mellitus without mention of complication, uncontrolled 01/30/2012  . CHF (congestive heart failure) 01/30/2012  . Essential hypertension, benign 01/30/2012  . Other and unspecified hyperlipidemia  01/30/2012  . DJD (degenerative joint disease) 01/30/2012  . CAD (coronary artery disease) 01/30/2012  . Glaucoma 01/30/2012  . Osteopenia 01/30/2012   Current Outpatient Prescriptions on File Prior to Visit  Medication Sig Dispense Refill  . amLODipine (NORVASC) 10 MG tablet TAKE 1 TABLET (10 MG TOTAL) BY MOUTH DAILY.  90 tablet  1  . aspirin 81 MG tablet Take 81 mg by mouth daily.      . benazepril (LOTENSIN) 20 MG tablet Take 1 tablet (20 mg total) by mouth daily.  90 tablet  2  . carvedilol (COREG) 12.5 MG tablet TAKE 1 TABLET BY MOUTH TWICE A DAY WITH MEAL  60 tablet  0  . clopidogrel (PLAVIX) 75 MG tablet       . furosemide (LASIX) 40 MG tablet TAKE 1/2 TABLET BY MOUTH DAILY  45 tablet  1  . glyBURIDE-metformin (GLUCOVANCE) 5-500 MG per tablet Take 1 tablet by mouth 2 (two) times daily.  60 tablet  5  . latanoprost (XALATAN) 0.005 % ophthalmic solution Place 1 drop into both eyes at bedtime.        No current facility-administered medications on file prior to  visit.       Review of Systems See HPI    Objective:   Physical Exam  Physical Exam  Nursing note and vitals reviewed.  Constitutional: She is oriented to person, place, and time. She appears well-developed and well-nourished.  HENT:  Head: Normocephalic and atraumatic.  Cardiovascular: Normal rate and regular rhythm. Exam reveals no gallop and no friction rub.  No murmur heard.  Pulmonary/Chest: Breath sounds normal. She has no wheezes. She has no rales.  Neurological: She is alert and oriented to person, place, and time.  Skin: Skin is warm and dry. Ext no edema  Foot exam  NO rashes no ulceratiosn  Good bilateral Pedal pulses.  Normal sensation to microfilament Psychiatric: She has a normal mood and affect. Her behavior is normal.             Assessment & Plan:  HTN:  Continue meds but take carvedilol 12.5 mg two tablets in am and one in pm  Diabetes  Will check AIC today  Micral  Done last November  Foot check fine

## 2013-04-27 NOTE — Telephone Encounter (Signed)
I refilled this for 30 days when you were out of town requesting to resume 90 days

## 2013-04-27 NOTE — Patient Instructions (Signed)
See me in 3 months

## 2013-04-28 ENCOUNTER — Encounter: Payer: Self-pay | Admitting: *Deleted

## 2013-04-28 LAB — BASIC METABOLIC PANEL
CO2: 27 mEq/L (ref 19–32)
Glucose, Bld: 152 mg/dL — ABNORMAL HIGH (ref 70–99)
Potassium: 4.1 mEq/L (ref 3.5–5.3)
Sodium: 139 mEq/L (ref 135–145)

## 2013-05-04 ENCOUNTER — Telehealth: Payer: Self-pay | Admitting: *Deleted

## 2013-05-04 NOTE — Telephone Encounter (Signed)
Has questions about blood test results. Wants to speak with Dr Constance Goltz.

## 2013-05-05 NOTE — Telephone Encounter (Signed)
Returned pt call regarding lab work. Pt was concerned about creatine, and which doctor she was to follow up with. Explained that she was to keep appt with nephrologist who would monitor her creatine levels. Mailed a copy of dash diet per pt request

## 2013-06-11 DIAGNOSIS — E11329 Type 2 diabetes mellitus with mild nonproliferative diabetic retinopathy without macular edema: Secondary | ICD-10-CM | POA: Diagnosis not present

## 2013-06-11 DIAGNOSIS — H35049 Retinal micro-aneurysms, unspecified, unspecified eye: Secondary | ICD-10-CM | POA: Diagnosis not present

## 2013-06-11 DIAGNOSIS — H43819 Vitreous degeneration, unspecified eye: Secondary | ICD-10-CM | POA: Diagnosis not present

## 2013-06-11 DIAGNOSIS — E1139 Type 2 diabetes mellitus with other diabetic ophthalmic complication: Secondary | ICD-10-CM | POA: Diagnosis not present

## 2013-06-11 DIAGNOSIS — H2510 Age-related nuclear cataract, unspecified eye: Secondary | ICD-10-CM | POA: Diagnosis not present

## 2013-06-21 ENCOUNTER — Encounter: Payer: Self-pay | Admitting: Internal Medicine

## 2013-06-21 DIAGNOSIS — E1159 Type 2 diabetes mellitus with other circulatory complications: Secondary | ICD-10-CM | POA: Insufficient documentation

## 2013-07-07 DIAGNOSIS — R809 Proteinuria, unspecified: Secondary | ICD-10-CM | POA: Diagnosis not present

## 2013-07-07 DIAGNOSIS — I10 Essential (primary) hypertension: Secondary | ICD-10-CM | POA: Diagnosis not present

## 2013-07-07 DIAGNOSIS — N390 Urinary tract infection, site not specified: Secondary | ICD-10-CM | POA: Diagnosis not present

## 2013-07-07 DIAGNOSIS — I129 Hypertensive chronic kidney disease with stage 1 through stage 4 chronic kidney disease, or unspecified chronic kidney disease: Secondary | ICD-10-CM | POA: Diagnosis not present

## 2013-07-14 DIAGNOSIS — H11159 Pinguecula, unspecified eye: Secondary | ICD-10-CM | POA: Diagnosis not present

## 2013-07-14 DIAGNOSIS — E119 Type 2 diabetes mellitus without complications: Secondary | ICD-10-CM | POA: Diagnosis not present

## 2013-07-14 DIAGNOSIS — H409 Unspecified glaucoma: Secondary | ICD-10-CM | POA: Diagnosis not present

## 2013-07-14 DIAGNOSIS — H4011X0 Primary open-angle glaucoma, stage unspecified: Secondary | ICD-10-CM | POA: Diagnosis not present

## 2013-07-14 DIAGNOSIS — E11319 Type 2 diabetes mellitus with unspecified diabetic retinopathy without macular edema: Secondary | ICD-10-CM | POA: Diagnosis not present

## 2013-07-14 DIAGNOSIS — H2510 Age-related nuclear cataract, unspecified eye: Secondary | ICD-10-CM | POA: Diagnosis not present

## 2013-07-30 ENCOUNTER — Other Ambulatory Visit: Payer: Self-pay | Admitting: *Deleted

## 2013-07-30 NOTE — Telephone Encounter (Signed)
Diabetic supplies order faxed to pharmacy

## 2013-08-18 ENCOUNTER — Encounter: Payer: Self-pay | Admitting: Internal Medicine

## 2013-08-18 ENCOUNTER — Ambulatory Visit (INDEPENDENT_AMBULATORY_CARE_PROVIDER_SITE_OTHER): Payer: Medicare Other | Admitting: Internal Medicine

## 2013-08-18 ENCOUNTER — Other Ambulatory Visit: Payer: Self-pay | Admitting: Internal Medicine

## 2013-08-18 VITALS — BP 161/80 | HR 65 | Temp 97.9°F | Resp 18 | Wt 143.0 lb

## 2013-08-18 DIAGNOSIS — R7989 Other specified abnormal findings of blood chemistry: Secondary | ICD-10-CM

## 2013-08-18 DIAGNOSIS — Z23 Encounter for immunization: Secondary | ICD-10-CM

## 2013-08-18 DIAGNOSIS — R799 Abnormal finding of blood chemistry, unspecified: Secondary | ICD-10-CM

## 2013-08-18 LAB — LIPID PANEL
Cholesterol: 282 mg/dL — ABNORMAL HIGH (ref 0–200)
HDL: 43 mg/dL (ref >39)
Total CHOL/HDL Ratio: 6.6 Ratio
Triglycerides: 208 mg/dL — ABNORMAL HIGH (ref <150)
VLDL: 42 mg/dL — ABNORMAL HIGH (ref 0–40)

## 2013-08-18 LAB — COMPREHENSIVE METABOLIC PANEL
ALT: 15 U/L (ref 0–35)
AST: 20 U/L (ref 0–37)
Calcium: 10 mg/dL (ref 8.4–10.5)
Chloride: 102 mEq/L (ref 96–112)
Creat: 1.14 mg/dL — ABNORMAL HIGH (ref 0.50–1.10)

## 2013-08-18 LAB — HEMOGLOBIN A1C
Hgb A1c MFr Bld: 6.5 % — ABNORMAL HIGH (ref <5.7)
Mean Plasma Glucose: 140 mg/dL — ABNORMAL HIGH (ref <117)

## 2013-08-18 NOTE — Patient Instructions (Signed)
Bring your home BP moniter on MOnday

## 2013-08-18 NOTE — Addendum Note (Signed)
Addended by: Mathews Robinsons on: 08/18/2013 03:18 PM   Modules accepted: Orders

## 2013-08-18 NOTE — Progress Notes (Signed)
Subjective:    Patient ID: Brenda Spence, female    DOB: 09/20/1927, 79 y.o.   MRN: [id number]  HPI Brenda Spence is here for follow up of diabetes and HTN  She reports she was advised to increase her Coreg to 25 mg bid by her nephrologist.  When she went home she purchased a new BP machine 1 month ago and her highest SBP has been in the 130's.  She did not start her increased dose.    She denies visual changes or peripheral paresthesias/numbness.    Tolerating  glucovance well  No Known Allergies Past Medical History  Diagnosis Date  . Post-menopausal   . CAD (coronary artery disease) 2006    PCI of RCA in [place], [place]  . Broken ribs   . CHF (congestive heart failure)   . Diabetes mellitus   . Hyperlipidemia   . Hypertension   . Arthritis   . Glaucoma    Past Surgical History  Procedure Laterality Date  . Breast surgery      fatty tissue  . Appendectomy    . Bladder surgery  1960  . Stent implant      heart   History   Social History  . Marital Status: Widowed    Spouse Name: N/A    Number of Children: 4  . Years of Education: N/A   Occupational History  . retired    Social History Main Topics  . Smoking status: Never Smoker   . Smokeless tobacco: Never Used  . Alcohol Use: No  . Drug Use: No  . Sexual Activity: No   Other Topics Concern  . Not on file   Social History Narrative   ** Merged History Encounter **       Family History  Problem Relation Age of Onset  . Diabetes Sister   . Cancer Sister     stomach  . Stroke Brother   . Coronary artery disease Mother    Patient Active Problem List   Diagnosis Date Noted  . Diabetes mellitus due to underlying condition with proliferative diabetic retinopathy without macular edema 06/21/2013  . History of diverticulosis 01/15/2013  . Hydronephrosis of right kidney 04/06/2012  . Hypercalcemia 03/04/2012  . Elevated serum creatinine 03/04/2012  . Type II or unspecified type diabetes mellitus without mention  of complication, uncontrolled 01/30/2012  . CHF (congestive heart failure) 01/30/2012  . Essential hypertension, benign 01/30/2012  . Other and unspecified hyperlipidemia 01/30/2012  . DJD (degenerative joint disease) 01/30/2012  . CAD (coronary artery disease) 01/30/2012  . Glaucoma 01/30/2012  . Osteopenia 01/30/2012   Current Outpatient Prescriptions on File Prior to Visit  Medication Sig Dispense Refill  . amLODipine (NORVASC) 10 MG tablet TAKE 1 TABLET (10 MG TOTAL) BY MOUTH DAILY.  90 tablet  1  . aspirin 81 MG tablet Take 81 mg by mouth daily.      . benazepril (LOTENSIN) 20 MG tablet Take 1 tablet (20 mg total) by mouth daily.  90 tablet  1  . carvedilol (COREG) 12.5 MG tablet Take two tablets in am and one tablet in pm  270 tablet  1  . clopidogrel (PLAVIX) 75 MG tablet       . furosemide (LASIX) 40 MG tablet TAKE 1/2 TABLET BY MOUTH DAILY  45 tablet  1  . glyBURIDE-metformin (GLUCOVANCE) 5-500 MG per tablet Take 1 tablet by mouth 2 (two) times daily.  60 tablet  5  . latanoprost (XALATAN) 0.005 % ophthalmic solution  Place 1 drop into both eyes at bedtime.        No current facility-administered medications on file prior to visit.       Review of Systems    see HPI Objective:   Physical Exam Physical Exam  Nursing note and vitals reviewed.   Repeat  BP  156/80 Constitutional: She is oriented to person, place, and time. She appears well-developed and well-nourished.  HENT:  Head: Normocephalic and atraumatic.  Cardiovascular: Normal rate and regular rhythm. Exam reveals no gallop and no friction rub.  No murmur heard.  Pulmonary/Chest: Breath sounds normal. She has no wheezes. She has no rales.  Neurological: She is alert and oriented to person, place, and time.  Skin: Skin is warm and dry.  Psychiatric: She has a normal mood and affect. Her behavior is normal.          Assessment & Plan:  HTN:  I have asked pt to bring her home BP moniter to office on Monday  and will compare readings here in office.  She is resistant to increasing doses of coreg at this point in time.   Further management based on comparison next week  DM  Micral today with AIC,  Foot exam normal  utd with opthalmology .   Will check lipids today  She is to see me Monday with her BP cuff

## 2013-08-19 ENCOUNTER — Telehealth: Payer: Self-pay | Admitting: *Deleted

## 2013-08-19 ENCOUNTER — Encounter: Payer: Self-pay | Admitting: *Deleted

## 2013-08-19 LAB — MICROALBUMIN / CREATININE URINE RATIO: Microalb, Ur: 9.25 mg/dL — ABNORMAL HIGH (ref 0.00–1.89)

## 2013-08-19 NOTE — Telephone Encounter (Signed)
Message copied by Malena Catholic on Wed Aug 19, 2013  8:29 AM ------      Message from: Raechel Chute D      Created: Wed Aug 19, 2013  7:52 AM       Brenda Spence               Call pt with results and schedule 30 min office visit with me to discuss her cholesterol ------

## 2013-08-19 NOTE — Telephone Encounter (Signed)
Spoke with Brenda Spence this morning and left her know that her lipid profile numbers are much higher than last year.  She is coming in on Monday to compare her BP monitor to ours; so she is agreeable to also see Dr to discuss her labwork.  She has appt for 11/10 at 11:30 am

## 2013-08-23 ENCOUNTER — Emergency Department (HOSPITAL_BASED_OUTPATIENT_CLINIC_OR_DEPARTMENT_OTHER)
Admission: EM | Admit: 2013-08-23 | Discharge: 2013-08-24 | Discharge status: Against Medical Advice | Payer: Medicare Other | Attending: Emergency Medicine | Admitting: Emergency Medicine

## 2013-08-23 ENCOUNTER — Other Ambulatory Visit: Payer: Self-pay

## 2013-08-23 ENCOUNTER — Encounter (HOSPITAL_BASED_OUTPATIENT_CLINIC_OR_DEPARTMENT_OTHER): Payer: Self-pay | Admitting: Emergency Medicine

## 2013-08-23 ENCOUNTER — Emergency Department (HOSPITAL_BASED_OUTPATIENT_CLINIC_OR_DEPARTMENT_OTHER): Payer: Medicare Other

## 2013-08-23 DIAGNOSIS — Z8639 Personal history of other endocrine, nutritional and metabolic disease: Secondary | ICD-10-CM | POA: Insufficient documentation

## 2013-08-23 DIAGNOSIS — I509 Heart failure, unspecified: Secondary | ICD-10-CM | POA: Diagnosis not present

## 2013-08-23 DIAGNOSIS — M129 Arthropathy, unspecified: Secondary | ICD-10-CM | POA: Diagnosis not present

## 2013-08-23 DIAGNOSIS — H409 Unspecified glaucoma: Secondary | ICD-10-CM | POA: Insufficient documentation

## 2013-08-23 DIAGNOSIS — M25559 Pain in unspecified hip: Secondary | ICD-10-CM | POA: Diagnosis not present

## 2013-08-23 DIAGNOSIS — Z9861 Coronary angioplasty status: Secondary | ICD-10-CM | POA: Diagnosis not present

## 2013-08-23 DIAGNOSIS — S60229A Contusion of unspecified hand, initial encounter: Secondary | ICD-10-CM | POA: Insufficient documentation

## 2013-08-23 DIAGNOSIS — I10 Essential (primary) hypertension: Secondary | ICD-10-CM | POA: Insufficient documentation

## 2013-08-23 DIAGNOSIS — M79609 Pain in unspecified limb: Secondary | ICD-10-CM | POA: Diagnosis not present

## 2013-08-23 DIAGNOSIS — Z780 Asymptomatic menopausal state: Secondary | ICD-10-CM | POA: Insufficient documentation

## 2013-08-23 DIAGNOSIS — E119 Type 2 diabetes mellitus without complications: Secondary | ICD-10-CM | POA: Diagnosis not present

## 2013-08-23 DIAGNOSIS — S6000XA Contusion of unspecified finger without damage to nail, initial encounter: Secondary | ICD-10-CM | POA: Insufficient documentation

## 2013-08-23 DIAGNOSIS — M25539 Pain in unspecified wrist: Secondary | ICD-10-CM | POA: Diagnosis not present

## 2013-08-23 DIAGNOSIS — W19XXXA Unspecified fall, initial encounter: Secondary | ICD-10-CM

## 2013-08-23 DIAGNOSIS — S79919A Unspecified injury of unspecified hip, initial encounter: Secondary | ICD-10-CM | POA: Diagnosis not present

## 2013-08-23 DIAGNOSIS — S8000XA Contusion of unspecified knee, initial encounter: Secondary | ICD-10-CM | POA: Diagnosis not present

## 2013-08-23 DIAGNOSIS — Y939 Activity, unspecified: Secondary | ICD-10-CM | POA: Insufficient documentation

## 2013-08-23 DIAGNOSIS — Z8781 Personal history of (healed) traumatic fracture: Secondary | ICD-10-CM | POA: Diagnosis not present

## 2013-08-23 DIAGNOSIS — S8990XA Unspecified injury of unspecified lower leg, initial encounter: Secondary | ICD-10-CM | POA: Diagnosis not present

## 2013-08-23 DIAGNOSIS — Z7982 Long term (current) use of aspirin: Secondary | ICD-10-CM | POA: Diagnosis not present

## 2013-08-23 DIAGNOSIS — S7000XA Contusion of unspecified hip, initial encounter: Secondary | ICD-10-CM | POA: Diagnosis not present

## 2013-08-23 DIAGNOSIS — S6990XA Unspecified injury of unspecified wrist, hand and finger(s), initial encounter: Secondary | ICD-10-CM | POA: Diagnosis not present

## 2013-08-23 DIAGNOSIS — T07XXXA Unspecified multiple injuries, initial encounter: Secondary | ICD-10-CM

## 2013-08-23 DIAGNOSIS — I2510 Atherosclerotic heart disease of native coronary artery without angina pectoris: Secondary | ICD-10-CM | POA: Insufficient documentation

## 2013-08-23 DIAGNOSIS — M25569 Pain in unspecified knee: Secondary | ICD-10-CM | POA: Diagnosis not present

## 2013-08-23 DIAGNOSIS — Y92009 Unspecified place in unspecified non-institutional (private) residence as the place of occurrence of the external cause: Secondary | ICD-10-CM | POA: Insufficient documentation

## 2013-08-23 DIAGNOSIS — R296 Repeated falls: Secondary | ICD-10-CM | POA: Insufficient documentation

## 2013-08-23 DIAGNOSIS — Z862 Personal history of diseases of the blood and blood-forming organs and certain disorders involving the immune mechanism: Secondary | ICD-10-CM | POA: Diagnosis not present

## 2013-08-23 DIAGNOSIS — S59909A Unspecified injury of unspecified elbow, initial encounter: Secondary | ICD-10-CM | POA: Diagnosis not present

## 2013-08-23 LAB — CBC WITH DIFFERENTIAL/PLATELET
Basophils Relative: 0 % (ref 0–1)
HCT: 36.4 % (ref 36.0–46.0)
Hemoglobin: 12.2 g/dL (ref 12.0–15.0)
Lymphocytes Relative: 17 % (ref 12–46)
Lymphs Abs: 1.9 10*3/uL (ref 0.7–4.0)
MCHC: 33.5 g/dL (ref 30.0–36.0)
MCV: 92.9 fL (ref 78.0–100.0)
Monocytes Absolute: 0.7 10*3/uL (ref 0.1–1.0)
Monocytes Relative: 6 % (ref 3–12)
Neutro Abs: 8.3 10*3/uL — ABNORMAL HIGH (ref 1.7–7.7)
RBC: 3.92 MIL/uL (ref 3.87–5.11)
RDW: 10.7 % — ABNORMAL LOW (ref 11.5–15.5)

## 2013-08-23 NOTE — ED Notes (Signed)
Pt reports fall while leaving home down one step on to concrete c/o left hip, knee and hand pain no deformity but bruising to left knee noted

## 2013-08-23 NOTE — ED Notes (Signed)
MD at bedside. 

## 2013-08-23 NOTE — ED Provider Notes (Signed)
CSN: [id number]     Arrival date & time 08/23/13  2116 History  This chart was scribed for Loren Racer, MD by Dorothey Baseman, ED Scribe. This patient was seen in room MH11/MH11 and the patient's care was started at 11:10 PM.    Chief Complaint  Patient presents with  . Fall   Patient is a 79 y.o. female presenting with fall. The history is provided by the patient. No language interpreter was used.  Fall This is a new problem. The current episode started 6 to 12 hours ago. The problem has not changed since onset.Pertinent negatives include no chest pain and no shortness of breath. The symptoms are aggravated by walking.   HPI Comments: Brenda Spence is a 78 y.o. female with a history of arthritis who presents to the Emergency Department complaining of a fall that occurred around 8 hours ago. Patient reports that she fell from the last step onto concrete and landed on her left side. She denies tripping or hitting her head. Patient reports a possible, brief, syncopal episode. She reports associated, diffuse pain to the left side. She states that she has been ambulatory, but that the pain is exacerbated with walking. She denies chest pain, shortness of breath, bowel or bladder incontinence. Patient reports a history of CAD, CHF, DM, hyperlipidemia, HTN.   Past Medical History  Diagnosis Date  . Post-menopausal   . CAD (coronary artery disease) 2006    PCI of RCA in [place], [place]  . Broken ribs   . CHF (congestive heart failure)   . Diabetes mellitus   . Hyperlipidemia   . Hypertension   . Arthritis   . Glaucoma    Past Surgical History  Procedure Laterality Date  . Breast surgery      fatty tissue  . Appendectomy    . Bladder surgery  1960  . Stent implant      heart   Family History  Problem Relation Age of Onset  . Diabetes Sister   . Cancer Sister     stomach  . Stroke Brother   . Coronary artery disease Mother    History  Substance Use Topics  . Smoking status: Never  Smoker   . Smokeless tobacco: Never Used  . Alcohol Use: No   OB History   Grav Para Term Preterm Abortions TAB SAB Ect Mult Living   4 4 4       3      Review of Systems  Respiratory: Negative for shortness of breath.   Cardiovascular: Negative for chest pain.  Musculoskeletal: Positive for arthralgias and myalgias.  Skin: Positive for color change.  Neurological: Positive for syncope ( possible).  All other systems reviewed and are negative.    Allergies  Review of patient's allergies indicates no known allergies.  Home Medications   Current Outpatient Rx  Name  Route  Sig  Dispense  Refill  . amLODipine (NORVASC) 10 MG tablet      TAKE 1 TABLET (10 MG TOTAL) BY MOUTH DAILY.   90 tablet   1   . aspirin 81 MG tablet   Oral   Take 81 mg by mouth daily.         . benazepril (LOTENSIN) 20 MG tablet   Oral   Take 1 tablet (20 mg total) by mouth daily.   90 tablet   1   . carvedilol (COREG) 12.5 MG tablet      Take two tablets in am and one  tablet in pm   270 tablet   1   . clopidogrel (PLAVIX) 75 MG tablet               . furosemide (LASIX) 40 MG tablet      TAKE 1/2 TABLET BY MOUTH DAILY   45 tablet   1   . glyBURIDE-metformin (GLUCOVANCE) 5-500 MG per tablet   Oral   Take 1 tablet by mouth 2 (two) times daily.   60 tablet   5   . latanoprost (XALATAN) 0.005 % ophthalmic solution   Both Eyes   Place 1 drop into both eyes at bedtime.           Triage Vitals: BP 181/96  Pulse 96  Temp(Src) 97.8 F (36.6 C) (Oral)  Resp 18  Wt 137 lb (62.143 kg)  SpO2 95%  Physical Exam  Nursing note and vitals reviewed. Constitutional: She is oriented to person, place, and time. She appears well-developed and well-nourished. No distress.  HENT:  Head: Normocephalic and atraumatic.  Eyes: Conjunctivae are normal.  Neck: Normal range of motion. Neck supple.  Pulmonary/Chest: Effort normal. No respiratory distress.  Abdominal: She exhibits no  distension.  Musculoskeletal: Normal range of motion.  Tenderness to palpation to radial aspect of left wrist. Full range of motion of the left hip.   Neurological: She is alert and oriented to person, place, and time.  Skin: Skin is warm and dry. There is erythema.  Ecchymosis at base of right index finger, palmar aspect of the left hand, lateral surface of the left patella, and over the greater trochanter of the left hip. Mild erythema at base of 3rd right digit.   Psychiatric: She has a normal mood and affect. Her behavior is normal.    ED Course  Procedures (including critical care time)  DIAGNOSTIC STUDIES: Oxygen Saturation is 95% on room air, normal by my interpretation.    COORDINATION OF CARE: 11:13 PM- Will order x-rays of the left wrist, right hand, left hip, and left knee. Will order blood labs and UA. Discussed treatment plan with patient at bedside and patient verbalized agreement.     Labs Review Labs Reviewed - No data to display Imaging Review No results found.  EKG Interpretation   None       MDM  I personally performed the services described in this documentation, which was scribed in my presence. The recorded information has been reviewed and is accurate.  Patient and daughter refusing to wait for x-rays to be read by radiologist. I reviewed the x-rays and don't see any definite fractures or dislocations. Her labs without definite abnormality. EKG with nonspecific T wave changes. Patient is competent make medical decisions regarding her care. She has daughter aware that leaving before workup was this complete may result in missed injuries /fractures requiring intervention. She is encouraged to return at any point.   Loren Racer, MD 08/24/13 [phone]

## 2013-08-24 ENCOUNTER — Encounter: Payer: Self-pay | Admitting: Internal Medicine

## 2013-08-24 ENCOUNTER — Ambulatory Visit
Admission: RE | Admit: 2013-08-24 | Discharge: 2013-08-24 | Disposition: A | Discharge status: Home / Self Care | Payer: Medicare Other | Source: Ambulatory Visit | Attending: Orthopedic Surgery | Admitting: Orthopedic Surgery

## 2013-08-24 ENCOUNTER — Ambulatory Visit (INDEPENDENT_AMBULATORY_CARE_PROVIDER_SITE_OTHER): Payer: Medicare Other | Admitting: Internal Medicine

## 2013-08-24 ENCOUNTER — Other Ambulatory Visit: Payer: Self-pay | Admitting: Orthopedic Surgery

## 2013-08-24 VITALS — BP 165/81 | HR 72 | Temp 98.3°F | Resp 18

## 2013-08-24 DIAGNOSIS — S59909A Unspecified injury of unspecified elbow, initial encounter: Secondary | ICD-10-CM | POA: Diagnosis not present

## 2013-08-24 DIAGNOSIS — S72143A Displaced intertrochanteric fracture of unspecified femur, initial encounter for closed fracture: Secondary | ICD-10-CM

## 2013-08-24 DIAGNOSIS — S8990XA Unspecified injury of unspecified lower leg, initial encounter: Secondary | ICD-10-CM | POA: Diagnosis not present

## 2013-08-24 DIAGNOSIS — M25559 Pain in unspecified hip: Secondary | ICD-10-CM | POA: Diagnosis not present

## 2013-08-24 DIAGNOSIS — M25569 Pain in unspecified knee: Secondary | ICD-10-CM | POA: Diagnosis not present

## 2013-08-24 DIAGNOSIS — S6990XA Unspecified injury of unspecified wrist, hand and finger(s), initial encounter: Secondary | ICD-10-CM | POA: Diagnosis not present

## 2013-08-24 DIAGNOSIS — S79919A Unspecified injury of unspecified hip, initial encounter: Secondary | ICD-10-CM | POA: Diagnosis not present

## 2013-08-24 DIAGNOSIS — S72142A Displaced intertrochanteric fracture of left femur, initial encounter for closed fracture: Secondary | ICD-10-CM

## 2013-08-24 DIAGNOSIS — M25552 Pain in left hip: Secondary | ICD-10-CM

## 2013-08-24 DIAGNOSIS — M25539 Pain in unspecified wrist: Secondary | ICD-10-CM

## 2013-08-24 DIAGNOSIS — S62102A Fracture of unspecified carpal bone, left wrist, initial encounter for closed fracture: Secondary | ICD-10-CM

## 2013-08-24 DIAGNOSIS — S72002A Fracture of unspecified part of neck of left femur, initial encounter for closed fracture: Secondary | ICD-10-CM

## 2013-08-24 DIAGNOSIS — S62109A Fracture of unspecified carpal bone, unspecified wrist, initial encounter for closed fracture: Secondary | ICD-10-CM

## 2013-08-24 DIAGNOSIS — S52609A Unspecified fracture of lower end of unspecified ulna, initial encounter for closed fracture: Secondary | ICD-10-CM | POA: Diagnosis not present

## 2013-08-24 DIAGNOSIS — M79609 Pain in unspecified limb: Secondary | ICD-10-CM | POA: Diagnosis not present

## 2013-08-24 DIAGNOSIS — M25532 Pain in left wrist: Secondary | ICD-10-CM

## 2013-08-24 LAB — COMPREHENSIVE METABOLIC PANEL
ALT: 15 U/L (ref 0–35)
AST: 31 U/L (ref 0–37)
Albumin: 4 g/dL (ref 3.5–5.2)
Alkaline Phosphatase: 89 U/L (ref 39–117)
BUN: 22 mg/dL (ref 6–23)
CO2: 24 mEq/L (ref 19–32)
Calcium: 9.9 mg/dL (ref 8.4–10.5)
Chloride: 99 mEq/L (ref 96–112)
Glucose, Bld: 193 mg/dL — ABNORMAL HIGH (ref 70–99)
Potassium: 4.5 mEq/L (ref 3.5–5.1)
Total Protein: 8.4 g/dL — ABNORMAL HIGH (ref 6.0–8.3)

## 2013-08-24 NOTE — ED Notes (Signed)
Daughter up at desk stating that she and pt are going to leave and need piv out.  Patient very polite and pleasant, reports that daughter is wanting to leave b/c she has to work in the morning and 'it is getting late.'  PIV removed per request and pt signed ama form.

## 2013-08-24 NOTE — ED Notes (Signed)
Spoke to flow manager and made them aware that we need to attempt to call patient again and let her know  that she needs to be evaluated again at either [facility] or [facility]. Also spoke to patient's  Primary MD's nurse and made her aware of the situation (pt has an appointment scheduled with her primary MD today)

## 2013-08-24 NOTE — Progress Notes (Signed)
Subjective:    Patient ID: Brenda Spence, female    DOB: 08-02-1927, 79 y.o.   MRN: [id number] .   HPI Brenda Spence is here for acute visit.    She left [facility] ER AMA after falling when coming down her daughter's steps yesterday.  See ER note  No head injury and pt not sure if there was LOC.  ER    See xrays   She had a fractured L wrist and  Questionable nondisplaced fx of L hip.    She walked with walker today and drove to this office  No Known Allergies Past Medical History  Diagnosis Date  . Post-menopausal   . CAD (coronary artery disease) 2006    PCI of RCA in [place], [place]  . Broken ribs   . CHF (congestive heart failure)   . Diabetes mellitus   . Hyperlipidemia   . Hypertension   . Arthritis   . Glaucoma    Past Surgical History  Procedure Laterality Date  . Breast surgery      fatty tissue  . Appendectomy    . Bladder surgery  1960  . Stent implant      heart   History   Social History  . Marital Status: Widowed    Spouse Name: N/A    Number of Children: 4  . Years of Education: N/A   Occupational History  . retired    Social History Main Topics  . Smoking status: Never Smoker   . Smokeless tobacco: Never Used  . Alcohol Use: No  . Drug Use: No  . Sexual Activity: No   Other Topics Concern  . Not on file   Social History Narrative   ** Merged History Encounter **       Family History  Problem Relation Age of Onset  . Diabetes Sister   . Cancer Sister     stomach  . Stroke Brother   . Coronary artery disease Mother    Patient Active Problem List   Diagnosis Date Noted  . Diabetes mellitus due to underlying condition with proliferative diabetic retinopathy without macular edema 06/21/2013  . History of diverticulosis 01/15/2013  . Hydronephrosis of right kidney 04/06/2012  . Hypercalcemia 03/04/2012  . Elevated serum creatinine 03/04/2012  . Type II or unspecified type diabetes mellitus without mention of complication, uncontrolled 01/30/2012   . CHF (congestive heart failure) 01/30/2012  . Essential hypertension, benign 01/30/2012  . Other and unspecified hyperlipidemia 01/30/2012  . DJD (degenerative joint disease) 01/30/2012  . CAD (coronary artery disease) 01/30/2012  . Glaucoma 01/30/2012  . Osteopenia 01/30/2012   Current Outpatient Prescriptions on File Prior to Visit  Medication Sig Dispense Refill  . amLODipine (NORVASC) 10 MG tablet TAKE 1 TABLET (10 MG TOTAL) BY MOUTH DAILY.  90 tablet  1  . aspirin 81 MG tablet Take 81 mg by mouth daily.      . benazepril (LOTENSIN) 20 MG tablet Take 1 tablet (20 mg total) by mouth daily.  90 tablet  1  . carvedilol (COREG) 12.5 MG tablet Take two tablets in am and one tablet in pm  270 tablet  1  . clopidogrel (PLAVIX) 75 MG tablet       . furosemide (LASIX) 40 MG tablet TAKE 1/2 TABLET BY MOUTH DAILY  45 tablet  1  . glyBURIDE-metformin (GLUCOVANCE) 5-500 MG per tablet Take 1 tablet by mouth 2 (two) times daily.  60 tablet  5  . latanoprost (XALATAN) 0.005 %  ophthalmic solution Place 1 drop into both eyes at bedtime.        No current facility-administered medications on file prior to visit.       Review of Systems    see HPI Objective:   Physical Exam Physical Exam  Nursing note and vitals reviewed.  Repeat BP my exam R arm  148/86 Constitutional: She is oriented to person, place, and time. She appears well-developed and well-nourished.  HENT:  Head: Normocephalic and atraumatic.  Cardiovascular: Normal rate and regular rhythm. Exam reveals no gallop and no friction rub.  No murmur heard.  Pulmonary/Chest: Breath sounds normal. She has no wheezes. She has no rales.  Neurological: She is alert and oriented to person, place, and time.  M/S  L wrist reddened edematous  N-'v intact with strong pulse  Skin: Skin is warm and dry.  Psychiatric: She has a normal mood and affect. Her behavior is normal.         Assessment & Plan:  L wrist  And  Lhip fracture     Refer to orthopedic  She has appt today with Dr. Madelon Lips at 3:45.  I spoke with pts daughter [name] and she is leaving work to come and get her for transportation.    HTN:  She is to return to see me later this week but pt states she cannot make it then .  Will make appt for next week

## 2013-08-24 NOTE — Patient Instructions (Signed)
To go to orthopedic office   Arrive at 3:30 pm  [address]  [facility] [address] [address] office number  [phone]   Dr. Madelon Lips at [facility] orthopedics  See me this week on Thursday  30 min appt

## 2013-08-24 NOTE — ED Notes (Signed)
Attempted to call pt and notify her of her results of her hand xray and hip xray and the need to return for further care. No answer. Will continue to try and call pt

## 2013-08-24 NOTE — ED Notes (Signed)
Attempt to call patient again without answer. Will call her primary MD and also ask that the flow manager f/u with pt.

## 2013-08-24 NOTE — ED Notes (Signed)
Patient transported to X-ray via stretcher per tech. 

## 2013-08-31 ENCOUNTER — Encounter: Payer: Self-pay | Admitting: Internal Medicine

## 2013-08-31 ENCOUNTER — Ambulatory Visit (INDEPENDENT_AMBULATORY_CARE_PROVIDER_SITE_OTHER): Payer: Medicare Other | Admitting: Internal Medicine

## 2013-08-31 ENCOUNTER — Ambulatory Visit (HOSPITAL_BASED_OUTPATIENT_CLINIC_OR_DEPARTMENT_OTHER)
Admission: RE | Admit: 2013-08-31 | Discharge: 2013-08-31 | Disposition: A | Discharge status: Home / Self Care | Payer: Medicare Other | Source: Ambulatory Visit | Attending: Internal Medicine | Admitting: Internal Medicine

## 2013-08-31 ENCOUNTER — Telehealth: Payer: Self-pay | Admitting: *Deleted

## 2013-08-31 VITALS — BP 173/72 | HR 77 | Temp 97.9°F | Resp 20

## 2013-08-31 DIAGNOSIS — W19XXXA Unspecified fall, initial encounter: Secondary | ICD-10-CM

## 2013-08-31 DIAGNOSIS — R55 Syncope and collapse: Secondary | ICD-10-CM | POA: Diagnosis not present

## 2013-08-31 DIAGNOSIS — S0990XA Unspecified injury of head, initial encounter: Secondary | ICD-10-CM

## 2013-08-31 DIAGNOSIS — I6789 Other cerebrovascular disease: Secondary | ICD-10-CM | POA: Diagnosis not present

## 2013-08-31 NOTE — Telephone Encounter (Signed)
Brenda Spence called and said she just realized that she can not make the appointment on Wednesday with Dr Jens Som.  She states she has an eye appointment that day.  She wanted me to call and take the appointment in late December.  I explained to her that it is very important that she keep this appointment because Dr Constance Goltz is worried about her blacking out.  She needs to follow up with Dr Jens Som to find out what is causing her to black out and fall.  I told her that next time she blacks out she could get hurt even worse and end up in hospital.  I gave her Dr Ludwig Clarks address and phone number while in office today, and I told her she will need to call his office if unable to keep this appt on 11/19 at 2:45 and reschedule ASAP for her health and safety.  She voiced understanding even though she was not happy.

## 2013-08-31 NOTE — Progress Notes (Signed)
Subjective:    Patient ID: Brenda Spence, female    DOB: 1927/10/07, 79 y.o.   MRN: [id number]  HPI  Brenda Spence is here for follow up with her daughter Brenda Spence  She has seen Dr. Madelon Lips for her wrist Fracture. CT of her Hip showed no definitive fracture but  Soft tissue injury.  Dr. Madelon Lips recommended that she walk with a walker but I note that she came today with her cane.  She is not on any prescribed meds for pain   Pt reveals to me today that on 10/19 while visiting a relative in [place],  She fell on an escalator and hit the back of  her head.  She did not seek medical attention for this.   Her daughter Brenda Spence was with her.  Apparently there was no laceration  Only a "bump"  With the most recent fall down her daugher's steps,  She hit the side of her face (which she denied any head injury last visit).  She does not remember the entire incident.   She denies dizziness chest pain or palpitaitons prior to fall .  EKG done in ER showed no change when compared to prior EKG's  See BP  .  Dr. Eliott Nine her nephrologist had recommended pt increase her Coreg to 25 mg bid but pt has not done so as she says her blood pressue is normal when she is at home  No Known Allergies Past Medical History  Diagnosis Date  . Post-menopausal   . CAD (coronary artery disease) 2006    PCI of RCA in [place], [place]  . Broken ribs   . CHF (congestive heart failure)   . Diabetes mellitus   . Hyperlipidemia   . Hypertension   . Arthritis   . Glaucoma    Past Surgical History  Procedure Laterality Date  . Breast surgery      fatty tissue  . Appendectomy    . Bladder surgery  1960  . Stent implant      heart   History   Social History  . Marital Status: Widowed    Spouse Name: N/A    Number of Children: 4  . Years of Education: N/A   Occupational History  . retired    Social History Main Topics  . Smoking status: Never Smoker   . Smokeless tobacco: Never Used  . Alcohol Use: No  . Drug Use: No   . Sexual Activity: No   Other Topics Concern  . Not on file   Social History Narrative   ** Merged History Encounter **       Family History  Problem Relation Age of Onset  . Diabetes Sister   . Cancer Sister     stomach  . Stroke Brother   . Coronary artery disease Mother    Patient Active Problem List   Diagnosis Date Noted  . Diabetes mellitus due to underlying condition with proliferative diabetic retinopathy without macular edema 06/21/2013  . History of diverticulosis 01/15/2013  . Hydronephrosis of right kidney 04/06/2012  . Hypercalcemia 03/04/2012  . Elevated serum creatinine 03/04/2012  . Type II or unspecified type diabetes mellitus without mention of complication, uncontrolled 01/30/2012  . CHF (congestive heart failure) 01/30/2012  . Essential hypertension, benign 01/30/2012  . Other and unspecified hyperlipidemia 01/30/2012  . DJD (degenerative joint disease) 01/30/2012  . CAD (coronary artery disease) 01/30/2012  . Glaucoma 01/30/2012  . Osteopenia 01/30/2012   Current Outpatient Prescriptions on File Prior to Visit  Medication Sig Dispense Refill  . amLODipine (NORVASC) 10 MG tablet TAKE 1 TABLET (10 MG TOTAL) BY MOUTH DAILY.  90 tablet  1  . aspirin 81 MG tablet Take 81 mg by mouth daily.      . benazepril (LOTENSIN) 20 MG tablet Take 1 tablet (20 mg total) by mouth daily.  90 tablet  1  . carvedilol (COREG) 12.5 MG tablet Take two tablets in am and one tablet in pm  270 tablet  1  . clopidogrel (PLAVIX) 75 MG tablet       . furosemide (LASIX) 40 MG tablet TAKE 1/2 TABLET BY MOUTH DAILY  45 tablet  1  . glyBURIDE-metformin (GLUCOVANCE) 5-500 MG per tablet Take 1 tablet by mouth 2 (two) times daily.  60 tablet  5  . latanoprost (XALATAN) 0.005 % ophthalmic solution Place 1 drop into both eyes at bedtime.        No current facility-administered medications on file prior to visit.      Review of Systems See HPI    Objective:   Physical  Exam  Physical Exam  Nursing note and vitals reviewed.  Constitutional: She is oriented to person, place, and time. She appears well-developed and well-nourished.  HENT:  Head: Normocephalic and atraumatic.  Cardiovascular: Normal rate and regular rhythm. Exam reveals no gallop and no friction rub.  No murmur heard.  Pulmonary/Chest: Breath sounds normal. She has no wheezes. She has no rales.  Neurological: She is alert and oriented to person, place, and time.  CNII-XII intact Motor  5/5 muscle groups tested Reflexes  2+ symmetric  Did not test Left lower leg Cerebellar intact ftn Skin: Skin is warm and dry.   Large healing ecchymotic area  L lateral leg Psychiatric: She has a normal mood and affect. Her behavior is normal.        Assessment & Plan:   Syncope related to recent fall.:  She has fallen twice now and clinically appears to be related to syncope with most recent fall.  Will get Head CT without contrast due to renal insufficiency.  Refer to PT for balance assessment and advised to use her walker and not her cane.  Will also have her cardiologist evaluate   Wrist fracture:  Managed by orthopedic  Mild renal insufficiency  HTN   I do not think pt is taking her meds correctly.    Advised to take her coreg 25 mg bid as the nephrologist advised  Addendum:  I had telephone conversation and pts. Son  Isel Skufca who is a Armed forces operational officer in [place].  He is in agreeement with plan.  I also notified him that pt did not tell me about her fall 4 weeks ago while in [place]  She is to follow up with me in 2 weeks or sooner prn

## 2013-08-31 NOTE — Telephone Encounter (Signed)
Ms Devins called with the name of hotel where she was staying when she fell. Pt Reports that she was staying at the [facility] on [address] In [place]

## 2013-08-31 NOTE — Patient Instructions (Signed)
Will get head CT without contrast  You need to follow up with Dr. Jens Som  For possibly blacking out    See me in 2 weeks  PT appointment for balance assessment

## 2013-09-01 NOTE — Telephone Encounter (Signed)
Spoke with pt and informed of CT scan result.  No evidence of acute infarct but has evidence of old stroke  in R internal capsule and thalamus  with Chronic microvascular changes.    Will need work up.    Avised to take her ECASA daily.  Her cardiologist advised her to stop her Plavix  Last May    She also has follow up appt schedule with Dr. Jens Som .    I asked pt if I could inform her daughter [name]  But she states she will tell them.    I advised pt to bring her daughter with her on the next visit.

## 2013-09-02 ENCOUNTER — Ambulatory Visit: Payer: Medicare Other | Admitting: Cardiology

## 2013-09-03 IMAGING — CR DG CHEST 2V
2 series · 2 of 2 positions shown · non-contrast
Comparison: No priors.

CLINICAL DATA: Possible pneumonia.  Cough and shortness of breath.

CHEST - 2 VIEW

[w chest pa]
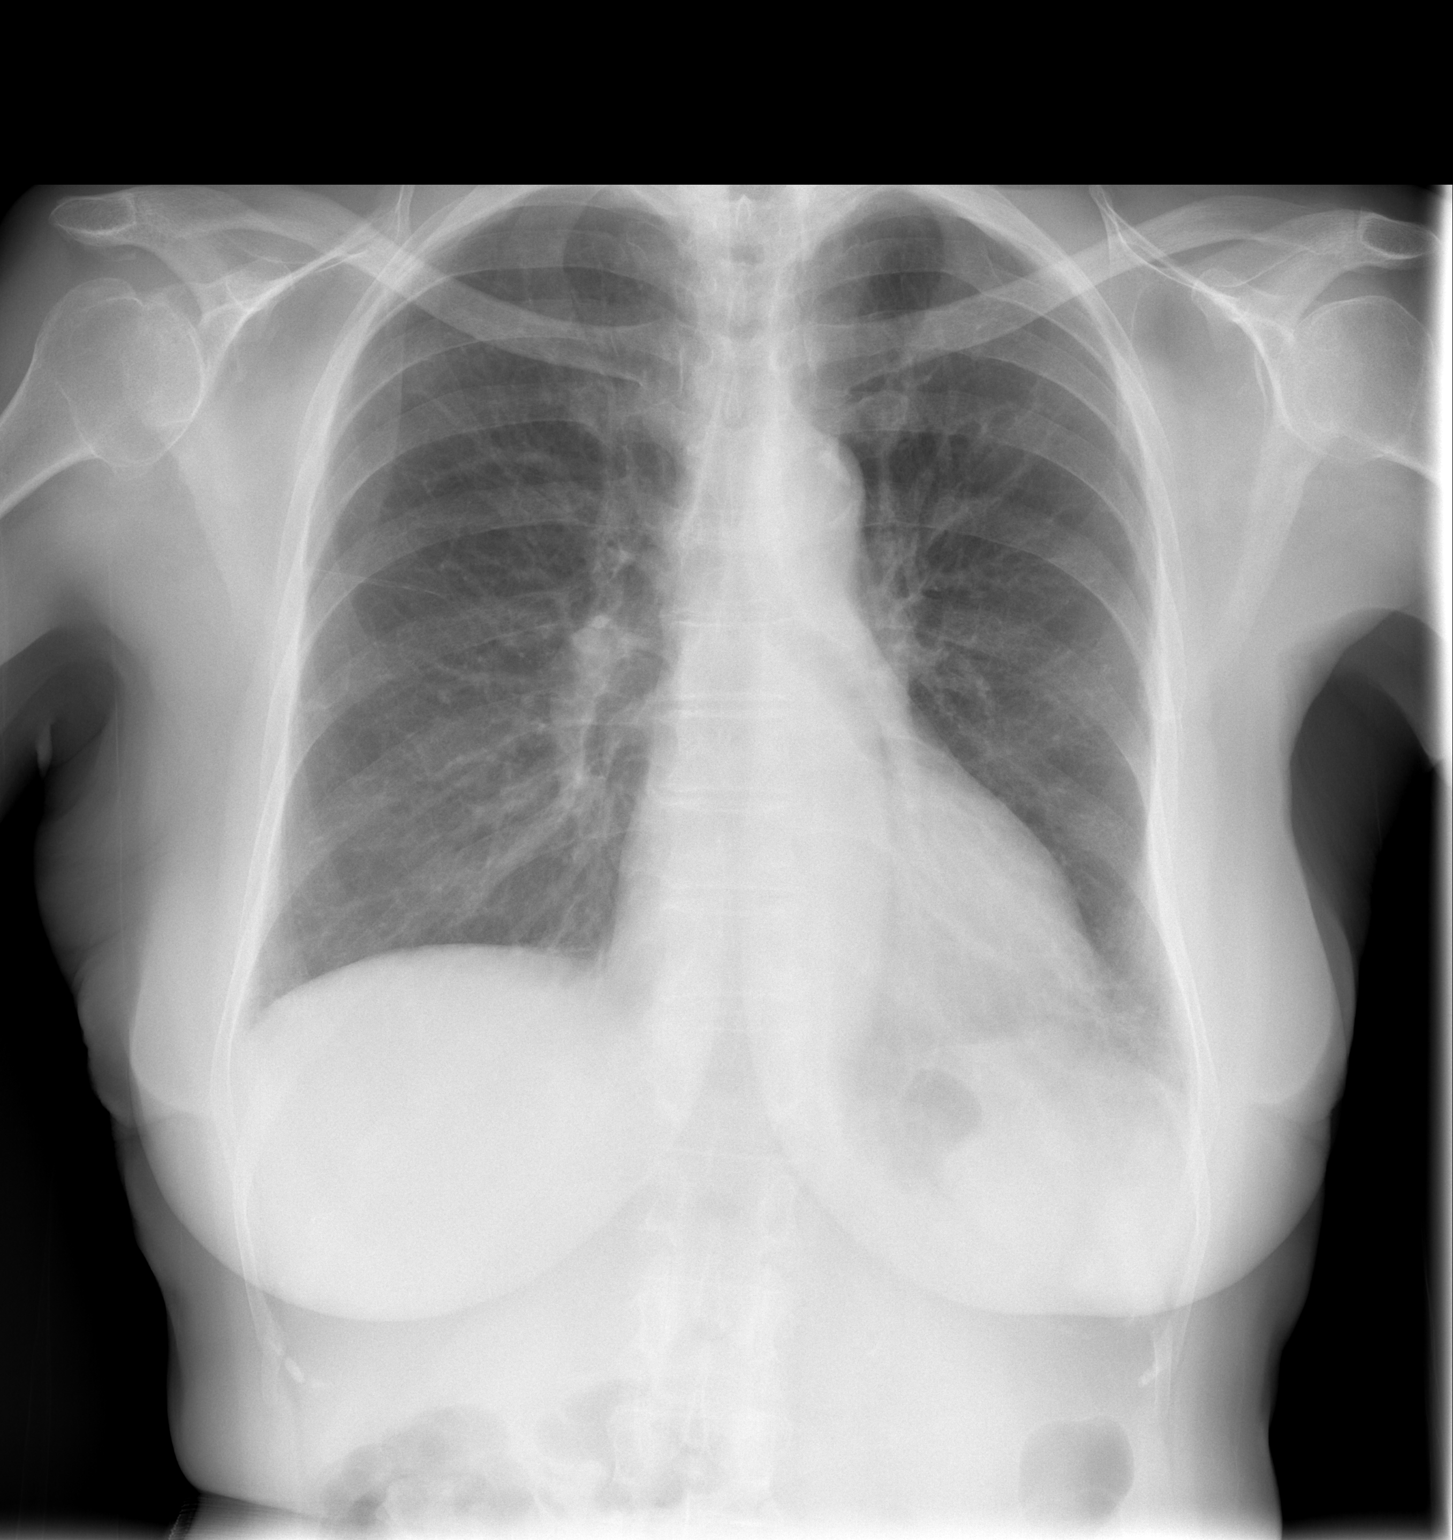

[w chest lat]
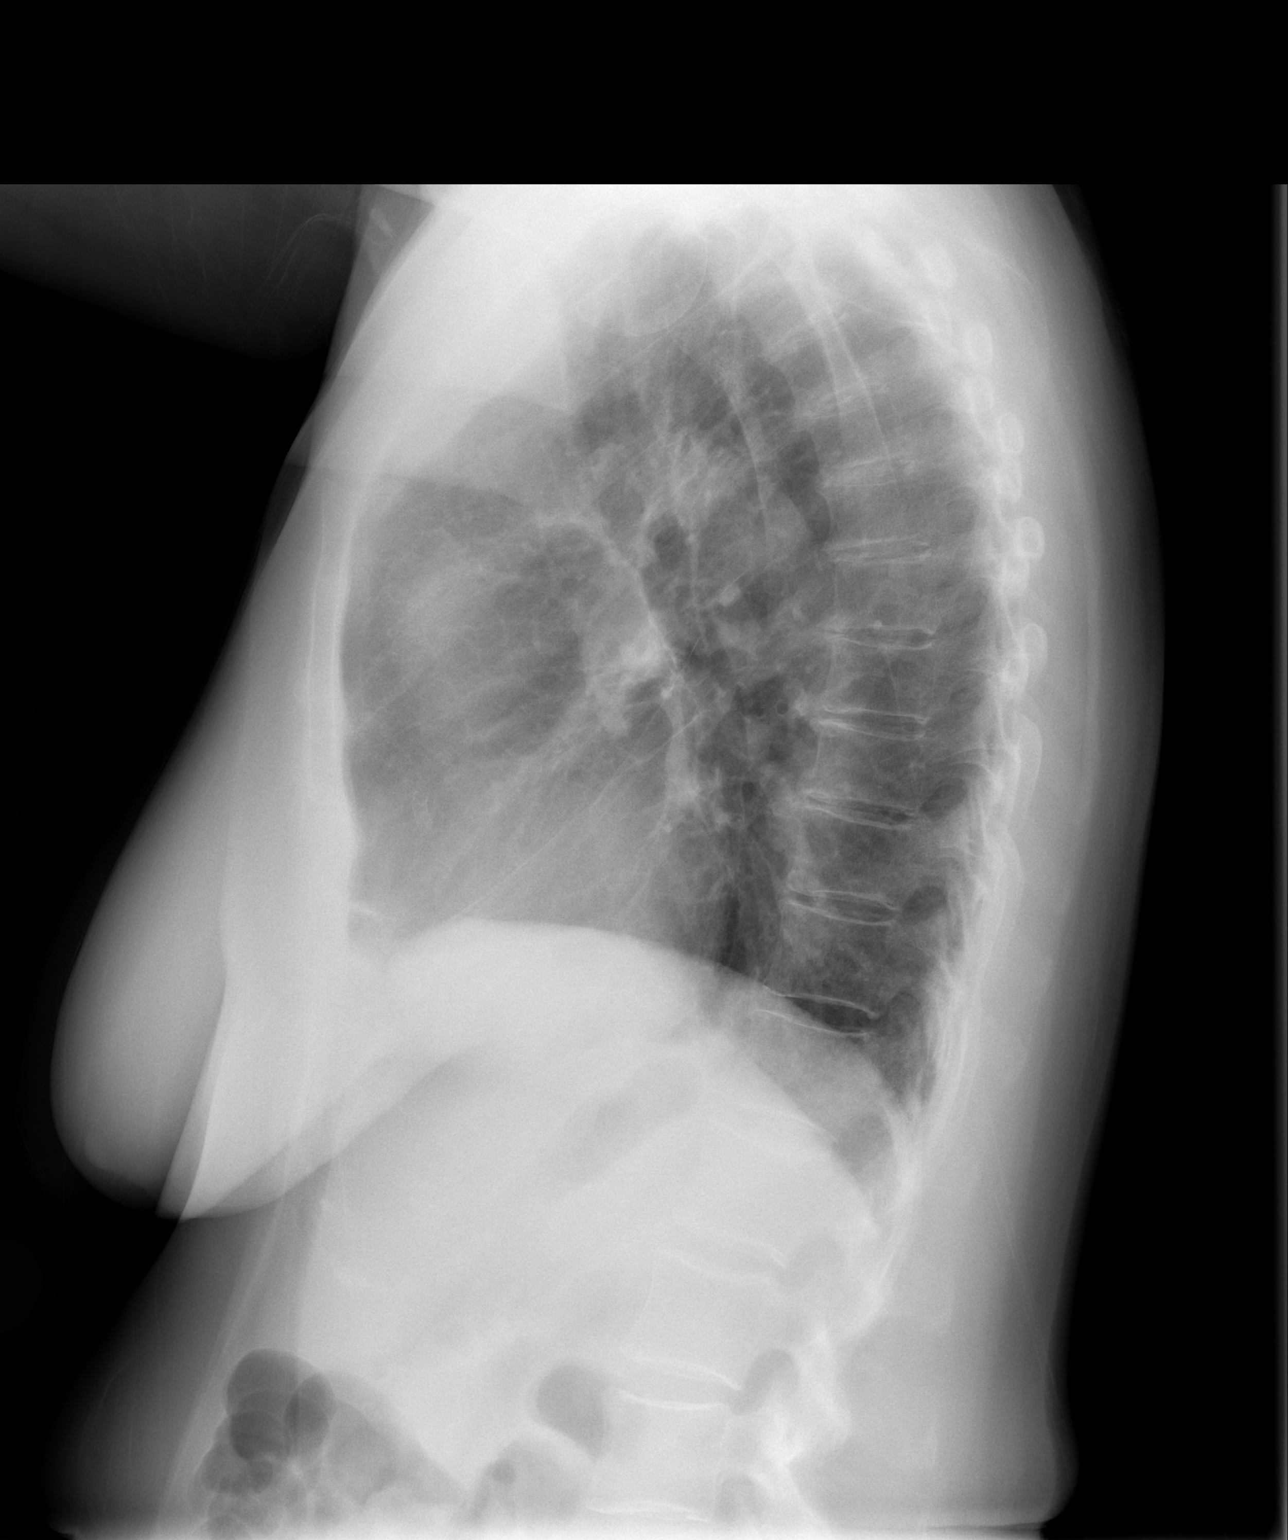

[2 of 2 positions shown; findings below may reference images not displayed]

FINDINGS: There is an ill-defined region of interstitial prominence
in the left base, partially obscuring the apex of the heart, which
could represent an area of scarring or early infection in the
lingula.  Lungs otherwise appear clear.  No definite pleural
effusions.  Pulmonary vasculature and the cardiomediastinal
silhouette are within normal limits.  Atherosclerotic
calcifications are noted within the arch of the aorta. Old healed
fractures of the posterior aspect of the left third and fourth ribs
are noted.
IMPRESSION: 1.  Ill-defined area of interstitial prominence in the lingula.
This may represent an area of chronic scarring, or could be
indicative of an early area of infection.  Clinical correlation is
recommended.
2.  Atherosclerosis.

## 2013-09-03 NOTE — Telephone Encounter (Signed)
Courtesy call

## 2013-09-04 ENCOUNTER — Ambulatory Visit: Payer: Medicare Other | Attending: Internal Medicine | Admitting: Physical Therapy

## 2013-09-04 DIAGNOSIS — R269 Unspecified abnormalities of gait and mobility: Secondary | ICD-10-CM | POA: Insufficient documentation

## 2013-09-04 DIAGNOSIS — IMO0001 Reserved for inherently not codable concepts without codable children: Secondary | ICD-10-CM | POA: Insufficient documentation

## 2013-09-04 DIAGNOSIS — M6281 Muscle weakness (generalized): Secondary | ICD-10-CM | POA: Diagnosis not present

## 2013-09-05 ENCOUNTER — Other Ambulatory Visit: Payer: Self-pay | Admitting: Internal Medicine

## 2013-09-05 DIAGNOSIS — R55 Syncope and collapse: Secondary | ICD-10-CM

## 2013-09-12 ENCOUNTER — Other Ambulatory Visit: Payer: Self-pay | Admitting: Internal Medicine

## 2013-09-14 ENCOUNTER — Ambulatory Visit (INDEPENDENT_AMBULATORY_CARE_PROVIDER_SITE_OTHER): Payer: Medicare Other | Admitting: Internal Medicine

## 2013-09-14 ENCOUNTER — Other Ambulatory Visit: Payer: Self-pay | Admitting: *Deleted

## 2013-09-14 ENCOUNTER — Encounter: Payer: Self-pay | Admitting: Internal Medicine

## 2013-09-14 VITALS — BP 152/64 | HR 63 | Temp 97.2°F | Resp 18 | Wt 140.0 lb

## 2013-09-14 DIAGNOSIS — E1339 Other specified diabetes mellitus with other diabetic ophthalmic complication: Secondary | ICD-10-CM | POA: Diagnosis not present

## 2013-09-14 DIAGNOSIS — I10 Essential (primary) hypertension: Secondary | ICD-10-CM

## 2013-09-14 DIAGNOSIS — E083599 Diabetes mellitus due to underlying condition with proliferative diabetic retinopathy without macular edema, unspecified eye: Secondary | ICD-10-CM

## 2013-09-14 DIAGNOSIS — R799 Abnormal finding of blood chemistry, unspecified: Secondary | ICD-10-CM

## 2013-09-14 DIAGNOSIS — E11359 Type 2 diabetes mellitus with proliferative diabetic retinopathy without macular edema: Secondary | ICD-10-CM

## 2013-09-14 DIAGNOSIS — I6350 Cerebral infarction due to unspecified occlusion or stenosis of unspecified cerebral artery: Secondary | ICD-10-CM | POA: Diagnosis not present

## 2013-09-14 DIAGNOSIS — I639 Cerebral infarction, unspecified: Secondary | ICD-10-CM

## 2013-09-14 DIAGNOSIS — R7989 Other specified abnormal findings of blood chemistry: Secondary | ICD-10-CM

## 2013-09-14 DIAGNOSIS — E785 Hyperlipidemia, unspecified: Secondary | ICD-10-CM

## 2013-09-14 MED ORDER — AMLODIPINE BESYLATE 10 MG PO TABS: ORAL_TABLET | ORAL | Status: DC

## 2013-09-14 MED ORDER — FUROSEMIDE 40 MG PO TABS: ORAL_TABLET | ORAL | Status: DC

## 2013-09-14 NOTE — Progress Notes (Signed)
Subjective:    Patient ID: Brenda Spence, female    DOB: [birth year], 79 y.o.   MRN: [id number]  HPI  Brenda Spence is here for follow up after her fall and L wrist fracture.  She is being followed by Dr. Madelon Lips.  CT of L hip showed  No fracture ,  Only soft tissue injury  See head CT  No acute findings but Chronic  CVA in R anterior thalamus and internal capsule.  Pt doe not recall any stroke symptoms in past and cannot recall ever having a head CT.   She is on a daily aspirin .  She was on Plavix in the past but was told to stop this by her cardiologist   She has had 2 falls and at least one syncopal episode associated with most recent fall.    No Known Allergies Past Medical History  Diagnosis Date  . Post-menopausal   . CAD (coronary artery disease) 2006    PCI of RCA in [place], [place]  . Broken ribs   . CHF (congestive heart failure)   . Diabetes mellitus   . Hyperlipidemia   . Hypertension   . Arthritis   . Glaucoma    Past Surgical History  Procedure Laterality Date  . Breast surgery      fatty tissue  . Appendectomy    . Bladder surgery  1960  . Stent implant      heart   History   Social History  . Marital Status: Widowed    Spouse Name: N/A    Number of Children: 4  . Years of Education: N/A   Occupational History  . retired    Social History Main Topics  . Smoking status: Never Smoker   . Smokeless tobacco: Never Used  . Alcohol Use: No  . Drug Use: No  . Sexual Activity: No   Other Topics Concern  . Not on file   Social History Narrative   ** Merged History Encounter **       Family History  Problem Relation Age of Onset  . Diabetes Sister   . Cancer Sister     stomach  . Stroke Brother   . Coronary artery disease Mother    Patient Active Problem List   Diagnosis Date Noted  . Diabetes mellitus due to underlying condition with proliferative diabetic retinopathy without macular edema 06/21/2013  . History of diverticulosis 01/15/2013  .  Hydronephrosis of right kidney 04/06/2012  . Hypercalcemia 03/04/2012  . Elevated serum creatinine 03/04/2012  . Type II or unspecified type diabetes mellitus without mention of complication, uncontrolled 01/30/2012  . CHF (congestive heart failure) 01/30/2012  . Essential hypertension, benign 01/30/2012  . Other and unspecified hyperlipidemia 01/30/2012  . DJD (degenerative joint disease) 01/30/2012  . CAD (coronary artery disease) 01/30/2012  . Glaucoma 01/30/2012  . Osteopenia 01/30/2012   Current Outpatient Prescriptions on File Prior to Visit  Medication Sig Dispense Refill  . amLODipine (NORVASC) 10 MG tablet TAKE 1 TABLET (10 MG TOTAL) BY MOUTH DAILY.  90 tablet  1  . aspirin 81 MG tablet Take 81 mg by mouth daily.      . benazepril (LOTENSIN) 20 MG tablet Take 1 tablet (20 mg total) by mouth daily.  90 tablet  1  . carvedilol (COREG) 12.5 MG tablet Take two tablets in am and one tablet in pm  270 tablet  1  . clopidogrel (PLAVIX) 75 MG tablet       .  furosemide (LASIX) 40 MG tablet TAKE 1/2 TABLET BY MOUTH DAILY  45 tablet  1  . glyBURIDE-metformin (GLUCOVANCE) 5-500 MG per tablet Take 1 tablet by mouth 2 (two) times daily.  60 tablet  5  . latanoprost (XALATAN) 0.005 % ophthalmic solution Place 1 drop into both eyes at bedtime.        No current facility-administered medications on file prior to visit.     .  Review of Systems    see HPI   Objective:   Physical Exam Physical Exam  Nursing note and vitals reviewed.  Constitutional: She is oriented to person, place, and time. She appears well-developed and well-nourished.  HENT:  Head: Normocephalic and atraumatic.  Neck  :  No carotic bruit bilaterally.   Cardiovascular: Normal rate and regular rhythm. Exam reveals no gallop and no friction rub.  No murmur heard.  Pulmonary/Chest: Breath sounds normal. She has no wheezes. She has no rales.  Neurological: She is alert and oriented to person, place, and time.  Ext no  edema Skin: Skin is warm and dry.  Psychiatric: She has a normal mood and affect. Her behavior is normal.             Assessment & Plan:  Chronic CVA on CT:  Continue daily ASA  Will get carotid doppler.  She is seeing her cardiologist in am and will defer 2D echo to his discretion .    Hyperlipidemia  See recent labs.  One year ago lipid profile looked great  This is an extreme risk for her but she is hesitant to start a medication for this.  Will re-address at her February visit.    HTN:  She has been advised to take her carvedilol 25 mg bid but she does not do this as she says her blood pressure is normal at home.  She has systolic HTN.    Elevated Creatinine  Most recent value is normal  Diabetes  Aic great.  Advised to see me q 3 months for diabetes check  See me in 2 months or sooner prn

## 2013-09-14 NOTE — Patient Instructions (Signed)
Will set up appt for carotid doppler    See me in 2 months  30 mins

## 2013-09-15 ENCOUNTER — Ambulatory Visit: Payer: Medicare Other | Admitting: Rehabilitation

## 2013-09-15 ENCOUNTER — Ambulatory Visit (HOSPITAL_BASED_OUTPATIENT_CLINIC_OR_DEPARTMENT_OTHER)
Admission: RE | Admit: 2013-09-15 | Discharge: 2013-09-15 | Disposition: A | Discharge status: Home / Self Care | Payer: Medicare Other | Source: Ambulatory Visit | Attending: Internal Medicine | Admitting: Internal Medicine

## 2013-09-15 DIAGNOSIS — I6509 Occlusion and stenosis of unspecified vertebral artery: Secondary | ICD-10-CM | POA: Insufficient documentation

## 2013-09-15 DIAGNOSIS — Z9181 History of falling: Secondary | ICD-10-CM | POA: Insufficient documentation

## 2013-09-15 DIAGNOSIS — I658 Occlusion and stenosis of other precerebral arteries: Secondary | ICD-10-CM | POA: Diagnosis not present

## 2013-09-15 DIAGNOSIS — I639 Cerebral infarction, unspecified: Secondary | ICD-10-CM

## 2013-09-16 ENCOUNTER — Ambulatory Visit: Payer: Medicare Other | Attending: Internal Medicine | Admitting: Rehabilitation

## 2013-09-16 DIAGNOSIS — R269 Unspecified abnormalities of gait and mobility: Secondary | ICD-10-CM | POA: Diagnosis not present

## 2013-09-16 DIAGNOSIS — M6281 Muscle weakness (generalized): Secondary | ICD-10-CM | POA: Insufficient documentation

## 2013-09-16 DIAGNOSIS — IMO0001 Reserved for inherently not codable concepts without codable children: Secondary | ICD-10-CM | POA: Insufficient documentation

## 2013-09-17 DIAGNOSIS — M25559 Pain in unspecified hip: Secondary | ICD-10-CM | POA: Diagnosis not present

## 2013-09-17 DIAGNOSIS — R55 Syncope and collapse: Secondary | ICD-10-CM | POA: Insufficient documentation

## 2013-09-17 NOTE — Telephone Encounter (Signed)
Spoke with pt and informed of carotic ultrasound results.  Will check yearly  Advise to continue on her daily aspirin and to keep her upcoming appt with Dr. Jens Som for her syncope

## 2013-09-18 ENCOUNTER — Ambulatory Visit: Payer: Medicare Other | Admitting: Physical Therapy

## 2013-09-18 DIAGNOSIS — M6281 Muscle weakness (generalized): Secondary | ICD-10-CM | POA: Diagnosis not present

## 2013-09-18 DIAGNOSIS — IMO0001 Reserved for inherently not codable concepts without codable children: Secondary | ICD-10-CM | POA: Diagnosis not present

## 2013-09-18 DIAGNOSIS — R269 Unspecified abnormalities of gait and mobility: Secondary | ICD-10-CM | POA: Diagnosis not present

## 2013-09-22 ENCOUNTER — Ambulatory Visit: Payer: Medicare Other | Admitting: Physical Therapy

## 2013-09-22 DIAGNOSIS — R269 Unspecified abnormalities of gait and mobility: Secondary | ICD-10-CM | POA: Diagnosis not present

## 2013-09-22 DIAGNOSIS — M6281 Muscle weakness (generalized): Secondary | ICD-10-CM | POA: Diagnosis not present

## 2013-09-22 DIAGNOSIS — IMO0001 Reserved for inherently not codable concepts without codable children: Secondary | ICD-10-CM | POA: Diagnosis not present

## 2013-09-25 ENCOUNTER — Ambulatory Visit: Payer: Medicare Other | Admitting: Rehabilitation

## 2013-09-25 DIAGNOSIS — R269 Unspecified abnormalities of gait and mobility: Secondary | ICD-10-CM | POA: Diagnosis not present

## 2013-09-25 DIAGNOSIS — IMO0001 Reserved for inherently not codable concepts without codable children: Secondary | ICD-10-CM | POA: Diagnosis not present

## 2013-09-25 DIAGNOSIS — M6281 Muscle weakness (generalized): Secondary | ICD-10-CM | POA: Diagnosis not present

## 2013-09-28 ENCOUNTER — Telehealth: Payer: Self-pay | Admitting: *Deleted

## 2013-09-28 DIAGNOSIS — H35039 Hypertensive retinopathy, unspecified eye: Secondary | ICD-10-CM | POA: Diagnosis not present

## 2013-09-28 DIAGNOSIS — H4011X0 Primary open-angle glaucoma, stage unspecified: Secondary | ICD-10-CM | POA: Diagnosis not present

## 2013-09-28 DIAGNOSIS — E11319 Type 2 diabetes mellitus with unspecified diabetic retinopathy without macular edema: Secondary | ICD-10-CM | POA: Diagnosis not present

## 2013-09-28 DIAGNOSIS — H43819 Vitreous degeneration, unspecified eye: Secondary | ICD-10-CM | POA: Diagnosis not present

## 2013-09-28 DIAGNOSIS — H409 Unspecified glaucoma: Secondary | ICD-10-CM | POA: Diagnosis not present

## 2013-09-28 DIAGNOSIS — E119 Type 2 diabetes mellitus without complications: Secondary | ICD-10-CM | POA: Diagnosis not present

## 2013-09-28 DIAGNOSIS — H2510 Age-related nuclear cataract, unspecified eye: Secondary | ICD-10-CM | POA: Diagnosis not present

## 2013-09-28 NOTE — Telephone Encounter (Signed)
Brenda Spence needs refill for Gyburide Metformin sent to pharmacy.. She is requesting a 90 supply; so she doesn't have to go to pharmacy every month to pick it up.

## 2013-09-29 ENCOUNTER — Ambulatory Visit: Payer: Medicare Other | Admitting: Physical Therapy

## 2013-09-29 DIAGNOSIS — M6281 Muscle weakness (generalized): Secondary | ICD-10-CM | POA: Diagnosis not present

## 2013-09-29 DIAGNOSIS — IMO0001 Reserved for inherently not codable concepts without codable children: Secondary | ICD-10-CM | POA: Diagnosis not present

## 2013-09-29 DIAGNOSIS — R269 Unspecified abnormalities of gait and mobility: Secondary | ICD-10-CM | POA: Diagnosis not present

## 2013-09-30 ENCOUNTER — Encounter: Payer: Self-pay | Admitting: Cardiology

## 2013-09-30 ENCOUNTER — Telehealth: Payer: Self-pay | Admitting: Cardiology

## 2013-09-30 ENCOUNTER — Encounter: Payer: Self-pay | Admitting: *Deleted

## 2013-09-30 ENCOUNTER — Other Ambulatory Visit: Payer: Self-pay | Admitting: *Deleted

## 2013-09-30 ENCOUNTER — Ambulatory Visit (INDEPENDENT_AMBULATORY_CARE_PROVIDER_SITE_OTHER): Payer: Medicare Other | Admitting: Cardiology

## 2013-09-30 VITALS — BP 140/80 | HR 76 | Ht 63.0 in | Wt 139.0 lb

## 2013-09-30 DIAGNOSIS — R55 Syncope and collapse: Secondary | ICD-10-CM

## 2013-09-30 DIAGNOSIS — E785 Hyperlipidemia, unspecified: Secondary | ICD-10-CM | POA: Diagnosis not present

## 2013-09-30 DIAGNOSIS — I10 Essential (primary) hypertension: Secondary | ICD-10-CM

## 2013-09-30 DIAGNOSIS — I2510 Atherosclerotic heart disease of native coronary artery without angina pectoris: Secondary | ICD-10-CM | POA: Diagnosis not present

## 2013-09-30 MED ORDER — PRAVASTATIN SODIUM 40 MG PO TABS: 40.0000 mg | ORAL_TABLET | Freq: Every evening | ORAL | Status: DC

## 2013-09-30 NOTE — Assessment & Plan Note (Signed)
Blood pressure controlled. Continue present medications. 

## 2013-09-30 NOTE — Patient Instructions (Signed)
Your physician recommends that you schedule a follow-up appointment in: 8 WEEKS WITH DR Jens Som  Your physician has recommended that you wear an event monitor. Event monitors are medical devices that record the heart's electrical activity. Doctors most often [place] these monitors to diagnose arrhythmias. Arrhythmias are problems with the speed or rhythm of the heartbeat. The monitor is a small, portable device. You can wear one while you do your normal daily activities. This is usually used to diagnose what is causing palpitations/syncope (passing out).   Your physician has requested that you have a lexiscan myoview. For further information please visit [email or web address] Please follow instruction sheet, as given.   Your physician has requested that you have an echocardiogram. Echocardiography is a painless test that uses sound waves to create images of your heart. It provides your doctor with information about the size and shape of your heart and how well your heart's chambers and valves are working. This procedure takes approximately one hour. There are no restrictions for this procedure.   START PRAVASTATIN 40 MG ONCE DAILY  Your physician recommends that you return for lab work in: 6 WEEKS- DO NOT EAT PRIOR TO LAB WORK

## 2013-09-30 NOTE — Assessment & Plan Note (Signed)
Continue aspirin. Add statin. 

## 2013-09-30 NOTE — Assessment & Plan Note (Signed)
It is not clear that patient had a syncopal episode but certainly may have based on history. Plan CardioNet to exclude arrhythmia. Schedule nuclear study to exclude ischemia and echocardiogram to quantify LV function.

## 2013-09-30 NOTE — Assessment & Plan Note (Signed)
Begin Pravachol 40 mg daily. Check lipids and liver in 6 weeks.

## 2013-09-30 NOTE — Telephone Encounter (Signed)
Spoke with pt, she will start pravastatin tonight at bedtime

## 2013-09-30 NOTE — Progress Notes (Signed)
HPI: FU coronary artery disease. Patient has had previous PCI of the right coronary artery in 2006. Nuclear study in June of 2013 showed an ejection fraction of 55% and normal perfusion. Carotid Dopplers in December 2014 less than 50% bilateral stenosis. Patient denies dyspnea, chest pain or palpitations. She has had 2 falls recently. 1 occurred on an escalator and she feels she lost her balance. The second episode occurred at her daughter's home. She came down 3 stairs and then fell. She is not sure whether she passed out or not but does not understand why she fell. No preceding chest pain, nausea, dyspnea or palpitations. No incontinence or seizure activity. No orthostatic symptoms. She did fracture her wrist.   Current Outpatient Prescriptions  Medication Sig Dispense Refill  . amLODipine (NORVASC) 10 MG tablet Take one daily  90 tablet  1  . aspirin 81 MG tablet Take 81 mg by mouth daily.      . benazepril (LOTENSIN) 20 MG tablet Take 1 tablet (20 mg total) by mouth daily.  90 tablet  1  . carvedilol (COREG) 12.5 MG tablet Take two tablets pm      . furosemide (LASIX) 40 MG tablet TAKE 1/2 TABLET BY MOUTH DAILY  45 tablet  1  . furosemide (LASIX) 40 MG tablet Take 1/2 tablet daily  45 tablet  1  . glyBURIDE-metformin (GLUCOVANCE) 5-500 MG per tablet Take 1 tablet by mouth 2 (two) times daily.  60 tablet  5  . latanoprost (XALATAN) 0.005 % ophthalmic solution Place 1 drop into both eyes at bedtime.        No current facility-administered medications for this visit.     Past Medical History  Diagnosis Date  . Post-menopausal   . CAD (coronary artery disease) 2006    PCI of RCA in [place], [place]  . Broken ribs   . CHF (congestive heart failure)   . Diabetes mellitus   . Hyperlipidemia   . Hypertension   . Arthritis   . Glaucoma     Past Surgical History  Procedure Laterality Date  . Breast surgery      fatty tissue  . Appendectomy    . Bladder surgery  1960  . Stent  implant      heart    History   Social History  . Marital Status: Widowed    Spouse Name: N/A    Number of Children: 4  . Years of Education: N/A   Occupational History  . retired    Social History Main Topics  . Smoking status: Never Smoker   . Smokeless tobacco: Never Used  . Alcohol Use: No  . Drug Use: No  . Sexual Activity: No   Other Topics Concern  . Not on file   Social History Narrative   ** Merged History Encounter **        ROS: some hip and wrist pain but no fevers or chills, productive cough, hemoptysis, dysphasia, odynophagia, melena, hematochezia, dysuria, hematuria, rash, seizure activity, orthopnea, PND, pedal edema, claudication. Remaining systems are negative.  Physical Exam: Well-developed well-nourished in no acute distress.  Skin is warm and dry.  HEENT is normal.  Neck is supple.  Chest is clear to auscultation with normal expansion.  Cardiovascular exam is regular rate and rhythm.  Abdominal exam nontender or distended. No masses palpated. Extremities show no edema. neuro grossly intact  ECG 08/23/2013-sinus rhythm, left ventricular hypertrophy with repolarization abnormality, PVC.

## 2013-09-30 NOTE — Telephone Encounter (Signed)
New message    Pt picked up cholesterol medication today and want to know if she should start taking it today.

## 2013-09-30 NOTE — Telephone Encounter (Signed)
Refill request

## 2013-10-01 ENCOUNTER — Other Ambulatory Visit: Payer: Self-pay | Admitting: *Deleted

## 2013-10-01 ENCOUNTER — Encounter: Payer: Self-pay | Admitting: *Deleted

## 2013-10-01 MED ORDER — GLYBURIDE-METFORMIN 5-500 MG PO TABS: 1.0000 | ORAL_TABLET | Freq: Two times a day (BID) | ORAL | Status: DC

## 2013-10-01 NOTE — Progress Notes (Signed)
Patient ID: Brenda Spence, female   DOB: 1926/12/24, 79 y.o.   MRN: [id number] Patient enrolled with [employer] to mail a 30 day cardiac event monitor her home.

## 2013-10-02 ENCOUNTER — Ambulatory Visit: Payer: Medicare Other | Admitting: Rehabilitation

## 2013-10-02 DIAGNOSIS — M6281 Muscle weakness (generalized): Secondary | ICD-10-CM | POA: Diagnosis not present

## 2013-10-02 DIAGNOSIS — IMO0001 Reserved for inherently not codable concepts without codable children: Secondary | ICD-10-CM | POA: Diagnosis not present

## 2013-10-02 DIAGNOSIS — R269 Unspecified abnormalities of gait and mobility: Secondary | ICD-10-CM | POA: Diagnosis not present

## 2013-10-05 ENCOUNTER — Ambulatory Visit: Payer: Medicare Other | Admitting: Physical Therapy

## 2013-10-05 ENCOUNTER — Encounter: Payer: Self-pay | Admitting: Cardiology

## 2013-10-05 ENCOUNTER — Encounter (INDEPENDENT_AMBULATORY_CARE_PROVIDER_SITE_OTHER): Payer: Medicare Other

## 2013-10-05 DIAGNOSIS — M6281 Muscle weakness (generalized): Secondary | ICD-10-CM | POA: Diagnosis not present

## 2013-10-05 DIAGNOSIS — R269 Unspecified abnormalities of gait and mobility: Secondary | ICD-10-CM | POA: Diagnosis not present

## 2013-10-05 DIAGNOSIS — IMO0001 Reserved for inherently not codable concepts without codable children: Secondary | ICD-10-CM | POA: Diagnosis not present

## 2013-10-05 DIAGNOSIS — R55 Syncope and collapse: Secondary | ICD-10-CM | POA: Diagnosis not present

## 2013-10-12 ENCOUNTER — Ambulatory Visit: Payer: Medicare Other | Admitting: Physical Therapy

## 2013-10-12 ENCOUNTER — Other Ambulatory Visit: Payer: Self-pay | Admitting: *Deleted

## 2013-10-12 ENCOUNTER — Telehealth: Payer: Self-pay | Admitting: *Deleted

## 2013-10-12 DIAGNOSIS — IMO0001 Reserved for inherently not codable concepts without codable children: Secondary | ICD-10-CM | POA: Diagnosis not present

## 2013-10-12 DIAGNOSIS — M6281 Muscle weakness (generalized): Secondary | ICD-10-CM | POA: Diagnosis not present

## 2013-10-12 DIAGNOSIS — R269 Unspecified abnormalities of gait and mobility: Secondary | ICD-10-CM | POA: Diagnosis not present

## 2013-10-12 NOTE — Telephone Encounter (Signed)
Needs refill of Carvedilol (COREG) 12.5 MG tablet  **2 in am and 2 in pm** wants 90 day RX [facility] in [place].

## 2013-10-12 NOTE — Telephone Encounter (Signed)
Refill request

## 2013-10-13 MED ORDER — CARVEDILOL 25 MG PO TABS: 25.0000 mg | ORAL_TABLET | Freq: Two times a day (BID) | ORAL | Status: DC

## 2013-10-20 DIAGNOSIS — H2510 Age-related nuclear cataract, unspecified eye: Secondary | ICD-10-CM | POA: Diagnosis not present

## 2013-10-20 DIAGNOSIS — H4011X0 Primary open-angle glaucoma, stage unspecified: Secondary | ICD-10-CM | POA: Diagnosis not present

## 2013-10-20 DIAGNOSIS — H269 Unspecified cataract: Secondary | ICD-10-CM | POA: Diagnosis not present

## 2013-10-23 ENCOUNTER — Other Ambulatory Visit: Payer: Self-pay | Admitting: Internal Medicine

## 2013-10-23 NOTE — Telephone Encounter (Signed)
Refill request

## 2013-10-28 ENCOUNTER — Encounter (HOSPITAL_COMMUNITY): Payer: Medicare Other

## 2013-10-28 ENCOUNTER — Other Ambulatory Visit (HOSPITAL_COMMUNITY): Payer: Medicare Other

## 2013-11-04 ENCOUNTER — Ambulatory Visit (HOSPITAL_COMMUNITY): Payer: Medicare Other | Attending: Cardiology | Admitting: Radiology

## 2013-11-04 DIAGNOSIS — I509 Heart failure, unspecified: Secondary | ICD-10-CM | POA: Diagnosis not present

## 2013-11-04 DIAGNOSIS — I2510 Atherosclerotic heart disease of native coronary artery without angina pectoris: Secondary | ICD-10-CM | POA: Diagnosis not present

## 2013-11-04 DIAGNOSIS — I059 Rheumatic mitral valve disease, unspecified: Secondary | ICD-10-CM | POA: Diagnosis not present

## 2013-11-04 DIAGNOSIS — E785 Hyperlipidemia, unspecified: Secondary | ICD-10-CM | POA: Diagnosis not present

## 2013-11-04 DIAGNOSIS — I079 Rheumatic tricuspid valve disease, unspecified: Secondary | ICD-10-CM | POA: Diagnosis not present

## 2013-11-04 DIAGNOSIS — R55 Syncope and collapse: Secondary | ICD-10-CM | POA: Diagnosis not present

## 2013-11-04 DIAGNOSIS — I10 Essential (primary) hypertension: Secondary | ICD-10-CM | POA: Insufficient documentation

## 2013-11-04 NOTE — Progress Notes (Signed)
Echocardiogram performed.  

## 2013-11-09 ENCOUNTER — Telehealth: Payer: Self-pay | Admitting: *Deleted

## 2013-11-09 ENCOUNTER — Ambulatory Visit (HOSPITAL_COMMUNITY): Payer: Medicare Other | Attending: Cardiology | Admitting: Radiology

## 2013-11-09 VITALS — BP 137/71 | Ht 63.0 in | Wt 134.0 lb

## 2013-11-09 DIAGNOSIS — R55 Syncope and collapse: Secondary | ICD-10-CM | POA: Insufficient documentation

## 2013-11-09 DIAGNOSIS — I779 Disorder of arteries and arterioles, unspecified: Secondary | ICD-10-CM | POA: Diagnosis not present

## 2013-11-09 DIAGNOSIS — R0989 Other specified symptoms and signs involving the circulatory and respiratory systems: Secondary | ICD-10-CM | POA: Insufficient documentation

## 2013-11-09 DIAGNOSIS — R0609 Other forms of dyspnea: Secondary | ICD-10-CM | POA: Insufficient documentation

## 2013-11-09 DIAGNOSIS — I10 Essential (primary) hypertension: Secondary | ICD-10-CM | POA: Diagnosis not present

## 2013-11-09 DIAGNOSIS — E785 Hyperlipidemia, unspecified: Secondary | ICD-10-CM | POA: Insufficient documentation

## 2013-11-09 DIAGNOSIS — I2510 Atherosclerotic heart disease of native coronary artery without angina pectoris: Secondary | ICD-10-CM | POA: Diagnosis not present

## 2013-11-09 DIAGNOSIS — R0602 Shortness of breath: Secondary | ICD-10-CM | POA: Diagnosis not present

## 2013-11-09 MED ORDER — TECHNETIUM TC 99M SESTAMIBI GENERIC - CARDIOLITE
30.0000 | Freq: Once | INTRAVENOUS | Status: AC | PRN
  Administered 2013-11-09: 30 via INTRAVENOUS

## 2013-11-09 MED ORDER — TECHNETIUM TC 99M SESTAMIBI GENERIC - CARDIOLITE
10.0000 | Freq: Once | INTRAVENOUS | Status: AC | PRN
  Administered 2013-11-09: 10 via INTRAVENOUS

## 2013-11-09 MED ORDER — REGADENOSON 0.4 MG/5ML IV SOLN
0.4000 mg | Freq: Once | INTRAVENOUS | Status: AC
  Administered 2013-11-09: 0.4 mg via INTRAVENOUS

## 2013-11-09 NOTE — Progress Notes (Signed)
  [facility] SITE 3 NUCLEAR MED [address] [place], [place] [address] [phone]    Cardiology Nuclear Med Study  Brenda Spence is a [age] y.o. female     MRN : [id number]     DOB: June 11, 1927  Procedure Date: 11/09/2013  Nuclear Med Background Indication for Stress Test:  Evaluation for Ischemia, Stent Patency and PTCA Patency History:  CAD 2006 Stent/PTCA-RCA [facility]  03/12/12 MPI: NL EF: 55%  11/04/13 ECHO: EF: 50-55%  Cardiac Risk Factors: Carotid Disease, Family History - CAD, Hypertension, Lipids and NIDDM  Symptoms:  DOE and Syncope   Nuclear Pre-Procedure Caffeine/Decaff Intake:  None NPO After: 7:00am   Lungs:  clear O2 Sat: 97% on room air. IV 0.9% NS with Angio Cath:  22g  IV Site: R Antecubital  IV Started by:  Cathlyn Parsonsynthia Hasspacher, RN  Chest Size (in):  36 Cup Size: C  Height: 5\' 3"  (1.6 m)  Weight:  134 lb (60.782 kg)  BMI:  Body mass index is 23.74 kg/(m^2). Tech Comments:  Coreg taken at 0700 today,no CBG or Glyburide this am. IV infiltrated with restart IV 22G (L) angiocath x 1, tolerated well. Patsy Edwards,RN.    Nuclear Med Study 1 or 2 day study: 1 day  Stress Test Type:  Eugenie BirksLexiscan  Reading MD: n/a  Order Authorizing Provider:  Ripley FraiseBrian Crenshaw,MD  Resting Radionuclide: Technetium 8912m Sestamibi  Resting Radionuclide Dose: 10.8 mCi   Stress Radionuclide:  Technetium 7612m Sestamibi  Stress Radionuclide Dose: 33.0 mCi           Stress Protocol Rest HR: 59 Stress HR: 88  Rest BP: 137/71 Stress BP: 153/60  Exercise Time (min): n/a METS: n/a   Predicted Max HR: 136 bpm % Max HR: 64.71 bpm Rate Pressure Product: [id number]   Dose of Adenosine (mg):  n/a Dose of Lexiscan: 0.4 mg  Dose of Atropine (mg): n/a Dose of Dobutamine: n/a mcg/kg/min (at max HR)  Stress Test Technologist: Frederick Peerseresa Johnson, EMT-P  Nuclear Technologist:  Domenic PoliteStephen Carbone, CNMT     Rest Procedure:  Myocardial perfusion imaging was performed at rest 45 minutes following the  intravenous administration of Technetium 9312m Sestamibi. Rest ECG: SR 73  LVH with repolarization abnormality  Stress Procedure:  The patient received IV Lexiscan 0.4 mg over 15-seconds.  Technetium 7512m Sestamibi injected at 30-seconds. The patient had a funny feeling with the Lexiscan injection. Quantitative spect images were obtained after a 45 minute delay. Stress MVH:[id number]:Nondiagnostic due to baseline changes.   QPS Raw Data Images:  Soft tissue (diaphragm, bowel activity) underlies heart.   Stress Images:  Normal perfusion.   Rest Images:  Comparison with the stress images reveals no significant change. Subtraction (SDS):  No evidence of ischemia. Transient Ischemic Dilatation (Normal <1.22):  0.85 Lung/Heart Ratio (Normal <0.45):  0.46  Quantitative Gated Spect Images QGS EDV:  77 ml QGS ESV:  34 ml  Impression Exercise Capacity:  Lexiscan with no exercise. BP Response:  Normal blood pressure response. Clinical Symptoms:  No chest pain. ECG Impression:  Nondiagnostic due to baseline changes.   Comparison with Prior Nuclear Study:Previous study also normal  Overall Impression:  Normal stress nuclear study.  LV Ejection Fraction: 56%.  LV Wall Motion:  NL LV Function; NL Wall Motion  Dietrich PatesPaula Rudolfo Brandow

## 2013-11-09 NOTE — Telephone Encounter (Signed)
Unable to reach pt or leave a message  Monitor reviewed by dr Jens Somcrenshaw shows sinus

## 2013-11-10 NOTE — Telephone Encounter (Signed)
pt aware of results  

## 2013-11-12 ENCOUNTER — Telehealth: Payer: Self-pay | Admitting: Internal Medicine

## 2013-11-12 NOTE — Telephone Encounter (Signed)
I left voicemail message x2 to pt's. Son Dr. [facility] [place] to discuss his mother's test results  .  Advised for Dr.Patin to return call to office

## 2013-11-16 DIAGNOSIS — H2510 Age-related nuclear cataract, unspecified eye: Secondary | ICD-10-CM | POA: Diagnosis not present

## 2013-11-17 ENCOUNTER — Ambulatory Visit (INDEPENDENT_AMBULATORY_CARE_PROVIDER_SITE_OTHER): Payer: Medicare Other | Admitting: Internal Medicine

## 2013-11-17 ENCOUNTER — Encounter: Payer: Self-pay | Admitting: Internal Medicine

## 2013-11-17 VITALS — BP 170/73 | HR 70 | Temp 97.9°F | Resp 18

## 2013-11-17 DIAGNOSIS — E083599 Diabetes mellitus due to underlying condition with proliferative diabetic retinopathy without macular edema, unspecified eye: Secondary | ICD-10-CM

## 2013-11-17 DIAGNOSIS — R55 Syncope and collapse: Secondary | ICD-10-CM

## 2013-11-17 DIAGNOSIS — I10 Essential (primary) hypertension: Secondary | ICD-10-CM | POA: Diagnosis not present

## 2013-11-17 DIAGNOSIS — E11359 Type 2 diabetes mellitus with proliferative diabetic retinopathy without macular edema: Secondary | ICD-10-CM

## 2013-11-17 DIAGNOSIS — I2510 Atherosclerotic heart disease of native coronary artery without angina pectoris: Secondary | ICD-10-CM

## 2013-11-17 DIAGNOSIS — E1339 Other specified diabetes mellitus with other diabetic ophthalmic complication: Secondary | ICD-10-CM | POA: Diagnosis not present

## 2013-11-17 DIAGNOSIS — E785 Hyperlipidemia, unspecified: Secondary | ICD-10-CM

## 2013-11-17 NOTE — Patient Instructions (Signed)
See me in 3-4 months 

## 2013-11-17 NOTE — Progress Notes (Signed)
Subjective:    Patient ID: Brenda Spence, female    DOB: Nov 06, 1926, 80 y.o.   MRN: [id number]  HPI  Brenda Spence is here for follow up  See BP  She tells me her BP is always under 150 systolic at home  Diabetes  No paresthesias she is UTD with eye exam.  She is on ACE and statin  She has had extensive cardiology work up for syncope.  All testing adequate from cardiology standpoint No Known Allergies Past Medical History  Diagnosis Date  . Post-menopausal   . CAD (coronary artery disease) 2006    PCI of RCA in [place], [place]  . Broken ribs   . CHF (congestive heart failure)   . Diabetes mellitus   . Hyperlipidemia   . Hypertension   . Arthritis   . Glaucoma    Past Surgical History  Procedure Laterality Date  . Breast surgery      fatty tissue  . Appendectomy    . Bladder surgery  1960  . Stent implant      heart   History   Social History  . Marital Status: Widowed    Spouse Name: N/A    Number of Children: 4  . Years of Education: N/A   Occupational History  . retired    Social History Main Topics  . Smoking status: Never Smoker   . Smokeless tobacco: Never Used  . Alcohol Use: No  . Drug Use: No  . Sexual Activity: No   Other Topics Concern  . Not on file   Social History Narrative   ** Merged History Encounter **       Family History  Problem Relation Age of Onset  . Diabetes Sister   . Cancer Sister     stomach  . Stroke Brother   . Coronary artery disease Mother    Patient Active Problem List   Diagnosis Date Noted  . Syncope 09/17/2013  . Hyperlipidemia 09/14/2013  . Diabetes mellitus due to underlying condition with proliferative diabetic retinopathy without macular edema 06/21/2013  . History of diverticulosis 01/15/2013  . Hydronephrosis of right kidney 04/06/2012  . Hypercalcemia 03/04/2012  . Elevated serum creatinine 03/04/2012  . Type II or unspecified type diabetes mellitus without mention of complication, uncontrolled 01/30/2012    . CHF (congestive heart failure) 01/30/2012  . Essential hypertension, benign 01/30/2012  . Other and unspecified hyperlipidemia 01/30/2012  . DJD (degenerative joint disease) 01/30/2012  . CAD (coronary artery disease) 01/30/2012  . Glaucoma 01/30/2012  . Osteopenia 01/30/2012   Current Outpatient Prescriptions on File Prior to Visit  Medication Sig Dispense Refill  . amLODipine (NORVASC) 10 MG tablet Take one daily  90 tablet  1  . aspirin 81 MG tablet Take 81 mg by mouth daily.      . benazepril (LOTENSIN) 20 MG tablet Take 1 tablet (20 mg total) by mouth daily.  90 tablet  1  . carvedilol (COREG) 12.5 MG tablet TAKE TWO TABLETS IN AM AND ONE TABLET IN PM  270 tablet  1  . carvedilol (COREG) 25 MG tablet Take 1 tablet (25 mg total) by mouth 2 (two) times daily at 8 am and 10 pm. Take one tablet twice a day (one in AM and one in PM)  180 tablet  1  . furosemide (LASIX) 40 MG tablet TAKE 1/2 TABLET BY MOUTH DAILY  45 tablet  1  . furosemide (LASIX) 40 MG tablet Take 1/2 tablet daily  45  tablet  1  . glyBURIDE-metformin (GLUCOVANCE) 5-500 MG per tablet Take 1 tablet by mouth 2 (two) times daily.  180 tablet  1  . latanoprost (XALATAN) 0.005 % ophthalmic solution Place 1 drop into both eyes at bedtime.       . pravastatin (PRAVACHOL) 40 MG tablet Take 1 tablet (40 mg total) by mouth every evening.  30 tablet  12   No current facility-administered medications on file prior to visit.       Review of Systems See HPI    Objective:   Physical Exam  Physical Exam  Nursing note and vitals reviewed.  Constitutional: She is oriented to person, place, and time. She appears well-developed and well-nourished.  HENT:  Head: Normocephalic and atraumatic.  Cardiovascular: Normal rate and regular rhythm. Exam reveals no gallop and no friction rub.  No murmur heard.  Pulmonary/Chest: Breath sounds normal. She has no wheezes. She has no rales.  Neurological: She is alert and oriented to  person, place, and time.  Foot exam  No rashes or ulcerations.  Normal microfilamnet bilaterally good pp Skin: Skin is warm and dry.  Psychiatric: She has a normal mood and affect. Her behavior is normal.        Assessment & Plan:  Diabetes  Check AIC and urine micral  today on ace on statin.  Foot exam normal  Near syncope  All work up no cardiac etiology  HTN  Repeat BP improved.   Continue meds  I am not sure she is taking full dose bid that nephrology recommended  See m ein 3-4 months

## 2013-11-18 LAB — COMPREHENSIVE METABOLIC PANEL
ALBUMIN: 4.2 g/dL (ref 3.5–5.2)
ALT: 14 U/L (ref 0–35)
AST: 18 U/L (ref 0–37)
Alkaline Phosphatase: 76 U/L (ref 39–117)
BILIRUBIN TOTAL: 0.5 mg/dL (ref 0.2–1.2)
BUN: 22 mg/dL (ref 6–23)
CALCIUM: 9.5 mg/dL (ref 8.4–10.5)
CHLORIDE: 102 meq/L (ref 96–112)
CO2: 25 meq/L (ref 19–32)
Creat: 1.08 mg/dL (ref 0.50–1.10)
GLUCOSE: 230 mg/dL — AB (ref 70–99)
Potassium: 4.2 mEq/L (ref 3.5–5.3)
SODIUM: 138 meq/L (ref 135–145)
TOTAL PROTEIN: 7.2 g/dL (ref 6.0–8.3)

## 2013-11-18 LAB — HEMOGLOBIN A1C
Hgb A1c MFr Bld: 7 % — ABNORMAL HIGH (ref <5.7)
MEAN PLASMA GLUCOSE: 154 mg/dL — AB (ref <117)

## 2013-11-19 LAB — MICROALBUMIN / CREATININE URINE RATIO
Creatinine, Urine: 44.6 mg/dL
Microalb Creat Ratio: 704.3 mg/g — ABNORMAL HIGH (ref 0.0–30.0)
Microalb, Ur: 31.41 mg/dL — ABNORMAL HIGH (ref 0.00–1.89)

## 2013-11-20 ENCOUNTER — Encounter: Payer: Self-pay | Admitting: *Deleted

## 2013-12-01 ENCOUNTER — Other Ambulatory Visit: Payer: Self-pay | Admitting: Internal Medicine

## 2013-12-02 ENCOUNTER — Ambulatory Visit: Payer: Medicare Other | Admitting: Cardiology

## 2013-12-02 NOTE — Telephone Encounter (Signed)
Refill request

## 2013-12-09 ENCOUNTER — Encounter: Payer: Self-pay | Admitting: *Deleted

## 2013-12-18 ENCOUNTER — Telehealth: Payer: Self-pay | Admitting: Cardiology

## 2013-12-18 NOTE — Telephone Encounter (Signed)
Returned call to patient she stated she had lab work done 12/15/13 at Dr.Schoenhoff's office.Message sent to Dr.Crenshaw's nurse.

## 2013-12-18 NOTE — Telephone Encounter (Signed)
New Message:  Pt states she is calling to let Dr. Jens Somrenshaw know she had blood work done at Dr. Lonell FaceSchoenhoff's office in Feb...Marland Kitchen

## 2013-12-23 NOTE — Telephone Encounter (Signed)
Spoke with pt, labs from PCP. She has not had he lipids checked. Lab orders mailed to the pt for labs to be drawn in [facility]. Patient voiced understanding

## 2013-12-31 DIAGNOSIS — I10 Essential (primary) hypertension: Secondary | ICD-10-CM | POA: Diagnosis not present

## 2013-12-31 DIAGNOSIS — I2510 Atherosclerotic heart disease of native coronary artery without angina pectoris: Secondary | ICD-10-CM | POA: Diagnosis not present

## 2013-12-31 DIAGNOSIS — E785 Hyperlipidemia, unspecified: Secondary | ICD-10-CM | POA: Diagnosis not present

## 2013-12-31 DIAGNOSIS — R55 Syncope and collapse: Secondary | ICD-10-CM | POA: Diagnosis not present

## 2013-12-31 LAB — HEPATIC FUNCTION PANEL
ALT: 11 U/L (ref 0–35)
AST: 18 U/L (ref 0–37)
Albumin: 4 g/dL (ref 3.5–5.2)
Alkaline Phosphatase: 68 U/L (ref 39–117)
Bilirubin, Direct: 0.1 mg/dL (ref 0.0–0.3)
Indirect Bilirubin: 0.4 mg/dL (ref 0.2–1.2)
Total Bilirubin: 0.5 mg/dL (ref 0.2–1.2)
Total Protein: 6.9 g/dL (ref 6.0–8.3)

## 2013-12-31 LAB — LIPID PANEL
Cholesterol: 264 mg/dL — ABNORMAL HIGH (ref 0–200)
HDL: 45 mg/dL (ref >39)
LDL Cholesterol: 179 mg/dL — ABNORMAL HIGH (ref 0–99)
Total CHOL/HDL Ratio: 5.9 Ratio
Triglycerides: 198 mg/dL — ABNORMAL HIGH (ref <150)
VLDL: 40 mg/dL (ref 0–40)

## 2014-01-04 ENCOUNTER — Other Ambulatory Visit: Payer: Self-pay | Admitting: *Deleted

## 2014-01-05 DIAGNOSIS — H269 Unspecified cataract: Secondary | ICD-10-CM | POA: Diagnosis not present

## 2014-01-05 DIAGNOSIS — H4011X0 Primary open-angle glaucoma, stage unspecified: Secondary | ICD-10-CM | POA: Diagnosis not present

## 2014-01-05 DIAGNOSIS — H2510 Age-related nuclear cataract, unspecified eye: Secondary | ICD-10-CM | POA: Diagnosis not present

## 2014-01-09 ENCOUNTER — Other Ambulatory Visit: Payer: Self-pay | Admitting: Internal Medicine

## 2014-01-11 NOTE — Telephone Encounter (Signed)
Refill request

## 2014-01-12 DIAGNOSIS — H4011X0 Primary open-angle glaucoma, stage unspecified: Secondary | ICD-10-CM | POA: Diagnosis not present

## 2014-01-12 DIAGNOSIS — H2510 Age-related nuclear cataract, unspecified eye: Secondary | ICD-10-CM | POA: Diagnosis not present

## 2014-01-28 DIAGNOSIS — I129 Hypertensive chronic kidney disease with stage 1 through stage 4 chronic kidney disease, or unspecified chronic kidney disease: Secondary | ICD-10-CM | POA: Diagnosis not present

## 2014-01-28 DIAGNOSIS — N183 Chronic kidney disease, stage 3 unspecified: Secondary | ICD-10-CM | POA: Diagnosis not present

## 2014-01-28 DIAGNOSIS — E1129 Type 2 diabetes mellitus with other diabetic kidney complication: Secondary | ICD-10-CM | POA: Diagnosis not present

## 2014-01-28 DIAGNOSIS — I2510 Atherosclerotic heart disease of native coronary artery without angina pectoris: Secondary | ICD-10-CM | POA: Diagnosis not present

## 2014-02-02 ENCOUNTER — Other Ambulatory Visit: Payer: Self-pay | Admitting: *Deleted

## 2014-03-03 ENCOUNTER — Other Ambulatory Visit: Payer: Self-pay | Admitting: Internal Medicine

## 2014-03-03 DIAGNOSIS — Z1231 Encounter for screening mammogram for malignant neoplasm of breast: Secondary | ICD-10-CM

## 2014-03-10 ENCOUNTER — Other Ambulatory Visit: Payer: Self-pay | Admitting: Internal Medicine

## 2014-03-10 ENCOUNTER — Ambulatory Visit (HOSPITAL_BASED_OUTPATIENT_CLINIC_OR_DEPARTMENT_OTHER)
Admission: RE | Admit: 2014-03-10 | Discharge: 2014-03-10 | Disposition: A | Discharge status: Home / Self Care | Payer: Medicare Other | Source: Ambulatory Visit | Attending: Internal Medicine | Admitting: Internal Medicine

## 2014-03-10 DIAGNOSIS — Z1231 Encounter for screening mammogram for malignant neoplasm of breast: Secondary | ICD-10-CM

## 2014-03-11 NOTE — Telephone Encounter (Signed)
Refill request

## 2014-03-17 ENCOUNTER — Ambulatory Visit (INDEPENDENT_AMBULATORY_CARE_PROVIDER_SITE_OTHER): Payer: Medicare Other | Admitting: Internal Medicine

## 2014-03-17 ENCOUNTER — Encounter: Payer: Self-pay | Admitting: Internal Medicine

## 2014-03-17 VITALS — BP 165/66 | HR 66 | Temp 98.0°F | Resp 18 | Ht 63.0 in | Wt 146.0 lb

## 2014-03-17 DIAGNOSIS — I10 Essential (primary) hypertension: Secondary | ICD-10-CM

## 2014-03-17 DIAGNOSIS — IMO0001 Reserved for inherently not codable concepts without codable children: Secondary | ICD-10-CM | POA: Diagnosis not present

## 2014-03-17 DIAGNOSIS — I2510 Atherosclerotic heart disease of native coronary artery without angina pectoris: Secondary | ICD-10-CM

## 2014-03-17 DIAGNOSIS — E1165 Type 2 diabetes mellitus with hyperglycemia: Principal | ICD-10-CM

## 2014-03-17 LAB — HEMOGLOBIN A1C
HEMOGLOBIN A1C: 6.6 % — AB (ref <5.7)
MEAN PLASMA GLUCOSE: 143 mg/dL — AB (ref <117)

## 2014-03-17 MED ORDER — AMLODIPINE BESYLATE 10 MG PO TABS: ORAL_TABLET | ORAL | Status: DC

## 2014-03-17 NOTE — Patient Instructions (Signed)
See me in 3-4 months  30 mins 

## 2014-03-17 NOTE — Progress Notes (Signed)
Subjective:    Patient ID: Brenda Spence, female    DOB: 02/07/1927, 80 y.o.   MRN: [id number]  HPI Brenda Spence is here for diabetes follow up.  She is utd with eye exam  One ace and statin.  No paresthesias in extremities  She does have darkened skin on lower arms and lower legs  Bilaterally.  I had advised her to see a dermatoloigist in the past but she declined stating she wanted to show her son her skin lesions as her son is a Designer, jewellerypracticing dermatologist.    No Known Allergies Past Medical History  Diagnosis Date  . Post-menopausal   . CAD (coronary artery disease) 2006    PCI [place] RCA [place]  . Broken ribs   . CHF (congestive heart failure)   . Diabetes mellitus   . Hyperlipidemia   . Hypertension   . Arthritis   . Glaucoma    Past Surgical History  Procedure Laterality Date  . Breast surgery      fatty tissue  . Appendectomy    . Bladder surgery  1960  . Stent implant      heart   History   Social History  . Marital Status: Widowed    Spouse Name: N/A    Number of Children: 4  . Years of Education: N/A   Occupational History  . retired    Social History Main Topics  . Smoking status: Never Smoker   . Smokeless tobacco: Never Used  . Alcohol Use: No  . Drug Use: No  . Sexual Activity: No   Other Topics Concern  . Not on file   Social History Narrative   ** Merged History Encounter **       Family History  Problem Relation Age of Onset  . Diabetes Sister   . Cancer Sister     stomach  . Stroke Brother   . Coronary artery disease Mother    Patient Active Problem List   Diagnosis Date Noted  . Syncope 09/17/2013  . Hyperlipidemia 09/14/2013  . Diabetes mellitus due to underlying condition with proliferative diabetic retinopathy without macular edema 06/21/2013  . History of diverticulosis 01/15/2013  . Hydronephrosis of right kidney 04/06/2012  . Hypercalcemia 03/04/2012  . Elevated serum creatinine 03/04/2012  . Type II or unspecified type  diabetes mellitus without mention of complication, uncontrolled 01/30/2012  . CHF (congestive heart failure) 01/30/2012  . Essential hypertension, benign 01/30/2012  . Other and unspecified hyperlipidemia 01/30/2012  . DJD (degenerative joint disease) 01/30/2012  . CAD (coronary artery disease) 01/30/2012  . Glaucoma 01/30/2012  . Osteopenia 01/30/2012   Current Outpatient Prescriptions on File Prior to Visit  Medication Sig Dispense Refill  . amLODipine (NORVASC) 10 MG tablet TAKE 1 TABLET BY MOUTH EVERY DAY  90 tablet  1  . aspirin 81 MG tablet Take 81 mg by mouth daily.      . benazepril (LOTENSIN) 20 MG tablet TAKE 1 TABLET (20 MG TOTAL) BY MOUTH DAILY.  90 tablet  1  . benazepril (LOTENSIN) 20 MG tablet TAKE 1 TABLET (20 MG TOTAL) BY MOUTH DAILY.  90 tablet  0  . carvedilol (COREG) 12.5 MG tablet TAKE TWO TABLETS IN AM AND ONE TABLET IN PM  270 tablet  1  . carvedilol (COREG) 12.5 MG tablet TAKE TWO TABLETS IN AM AND ONE TABLET IN PM  270 tablet  0  . carvedilol (COREG) 25 MG tablet Take 1 tablet (25 mg total)  by mouth 2 (two) times daily at 8 am and 10 pm. Take one tablet twice a day (one in AM and one in PM)  180 tablet  1  . furosemide (LASIX) 40 MG tablet TAKE 1/2 TABLET BY MOUTH DAILY  45 tablet  1  . furosemide (LASIX) 40 MG tablet Take 1/2 tablet daily  45 tablet  1  . glyBURIDE-metformin (GLUCOVANCE) 5-500 MG per tablet Take 1 tablet by mouth 2 (two) times daily.  180 tablet  1  . latanoprost (XALATAN) 0.005 % ophthalmic solution Place 1 drop into both eyes at bedtime.        No current facility-administered medications on file prior to visit.       Review of Systems See HPI    Objective:   Physical Exam Physical Exam  Nursing note and vitals reviewed.  Constitutional: She is oriented to person, place, and time. She appears well-developed and well-nourished.  HENT:  Head: Normocephalic and atraumatic.  Cardiovascular: Normal rate and regular rhythm. Exam reveals no  gallop and no friction rub.  No murmur heard.  Pulmonary/Chest: Breath sounds normal. She has no wheezes. She has no rales.  Neurological: She is alert and oriented to person, place, and time.  Skin: Skin is warm and dry.  She does have hyperpigmentaion of lower arms and lower legs bilaterally Foot exam no rash or ulcerations.  Good pp bilaterally.  NOrmal sensation microfilament Psychiatric: She has a normal mood and affect. Her behavior is normal.             Assessment & Plan:  Diabetes  Check AIC today  UTD with all other issues.   Hyperpigmentation of skin  I again advised a Dermatologic evaluation and pt states she will think about this .  She has been given office number to Dr. Emily Filbert an dDr. Haverstock.  I did discuss this could be acanthosis nigricans and may be a sign of serious  Co-existing  auto-immune , endocrine or malignacy or may be  Skin darkening changes seen in Diabetics.   She has been counseled about the potential seriousness of this   HTN  She does have an element of white coat syndrome    Pt has been hesitant to take her Coreg bid as she has been advised.  She repeatedly tells me her SBP is under 150 at home.    See me in 3 months

## 2014-03-23 ENCOUNTER — Encounter: Payer: Self-pay | Admitting: *Deleted

## 2014-03-25 ENCOUNTER — Other Ambulatory Visit: Payer: Self-pay | Admitting: Internal Medicine

## 2014-03-26 ENCOUNTER — Other Ambulatory Visit: Payer: Self-pay | Admitting: *Deleted

## 2014-03-26 MED ORDER — GLYBURIDE-METFORMIN 5-500 MG PO TABS: 1.0000 | ORAL_TABLET | Freq: Two times a day (BID) | ORAL | Status: DC

## 2014-03-26 NOTE — Telephone Encounter (Signed)
Refill request

## 2014-03-26 NOTE — Telephone Encounter (Signed)
Refilled per Dr DDS V/O 

## 2014-06-07 ENCOUNTER — Other Ambulatory Visit: Payer: Self-pay | Admitting: Internal Medicine

## 2014-06-07 NOTE — Telephone Encounter (Signed)
Requested Medications     Medication name:  Name from pharmacy:  benazepril (LOTENSIN) 20 MG tablet  BENAZEPRIL HCL 20 MG TABLET    Sig: TAKE 1 TABLET (20 MG TOTAL) BY MOUTH DAILY.    Dispense: 90 tablet Refills: 0 Start: 06/07/2014  Class: Normal    Requested on: 01/11/2014    Originally ordered on: 07/22/2012 Last refill: 06/05/2014 Order History and Details

## 2014-06-09 ENCOUNTER — Other Ambulatory Visit: Payer: Self-pay | Admitting: Internal Medicine

## 2014-06-09 NOTE — Telephone Encounter (Signed)
Requested Medications     Medication name:  Name from pharmacy:  benazepril (LOTENSIN) 20 MG tablet  BENAZEPRIL HCL 20 MG TABLET    The source prescription has been discontinued.    Sig: TAKE 1 TABLET (20 MG TOTAL) BY MOUTH DAILY.    Dispense: 90 tablet Refills: 0 Start: 06/09/2014  Class: Normal    Requested on: 01/11/2014    Originally ordered on: 07/22/2012 Last refill: 06/05/2014 Order History and Details

## 2014-06-10 DIAGNOSIS — E11339 Type 2 diabetes mellitus with moderate nonproliferative diabetic retinopathy without macular edema: Secondary | ICD-10-CM | POA: Diagnosis not present

## 2014-06-10 DIAGNOSIS — E1139 Type 2 diabetes mellitus with other diabetic ophthalmic complication: Secondary | ICD-10-CM | POA: Diagnosis not present

## 2014-06-11 ENCOUNTER — Other Ambulatory Visit: Payer: Self-pay | Admitting: Internal Medicine

## 2014-06-11 NOTE — Telephone Encounter (Signed)
Refill request from pharmacy by mistake.

## 2014-06-16 ENCOUNTER — Ambulatory Visit (INDEPENDENT_AMBULATORY_CARE_PROVIDER_SITE_OTHER): Payer: Medicare Other | Admitting: Internal Medicine

## 2014-06-16 ENCOUNTER — Encounter: Payer: Self-pay | Admitting: Internal Medicine

## 2014-06-16 VITALS — BP 138/66 | HR 70 | Resp 16 | Ht 63.0 in | Wt 137.0 lb

## 2014-06-16 DIAGNOSIS — I10 Essential (primary) hypertension: Secondary | ICD-10-CM | POA: Diagnosis not present

## 2014-06-16 DIAGNOSIS — R799 Abnormal finding of blood chemistry, unspecified: Secondary | ICD-10-CM | POA: Diagnosis not present

## 2014-06-16 DIAGNOSIS — E1165 Type 2 diabetes mellitus with hyperglycemia: Secondary | ICD-10-CM

## 2014-06-16 DIAGNOSIS — Z7982 Long term (current) use of aspirin: Secondary | ICD-10-CM | POA: Diagnosis not present

## 2014-06-16 DIAGNOSIS — IMO0001 Reserved for inherently not codable concepts without codable children: Secondary | ICD-10-CM | POA: Diagnosis not present

## 2014-06-16 DIAGNOSIS — R21 Rash and other nonspecific skin eruption: Secondary | ICD-10-CM

## 2014-06-16 DIAGNOSIS — R7989 Other specified abnormal findings of blood chemistry: Secondary | ICD-10-CM

## 2014-06-16 LAB — COMPREHENSIVE METABOLIC PANEL
ALT: 14 U/L (ref 0–35)
AST: 19 U/L (ref 0–37)
Albumin: 4.1 g/dL (ref 3.5–5.2)
Alkaline Phosphatase: 70 U/L (ref 39–117)
BUN: 28 mg/dL — ABNORMAL HIGH (ref 6–23)
CO2: 26 meq/L (ref 19–32)
CREATININE: 1.34 mg/dL — AB (ref 0.50–1.10)
Calcium: 9.6 mg/dL (ref 8.4–10.5)
Chloride: 103 mEq/L (ref 96–112)
GLUCOSE: 208 mg/dL — AB (ref 70–99)
Potassium: 4.8 mEq/L (ref 3.5–5.3)
Sodium: 138 mEq/L (ref 135–145)
Total Bilirubin: 0.5 mg/dL (ref 0.2–1.2)
Total Protein: 7.5 g/dL (ref 6.0–8.3)

## 2014-06-16 NOTE — Patient Instructions (Signed)
See me 3-4 months  30 mins 

## 2014-06-16 NOTE — Progress Notes (Signed)
Subjective:    Patient ID: Brenda Spence, female    DOB: 21-Sep-1927, 80 y.o.   MRN: [id number]  HPI  Darin is here for follow up of  Diabetes  , HTN   DM  Good control  She is on ACE has seen opthamologist. No paresthesias.  Glucses at home range 112-217.   She drinks OJ at bedtime   HTN   Reports be 130's over 66 at home  Still has darkened macular lelsions on both legs.  Her dermatologist son is coming in a few weeks.   She declines seeing a dermatologist here.  She has not started any new medication and is on her current meds for years.    No Known Allergies Past Medical History  Diagnosis Date  . Post-menopausal   . CAD (coronary artery disease) 2006    PCI of RCA in [place], [place]  . Broken ribs   . CHF (congestive heart failure)   . Diabetes mellitus   . Hyperlipidemia   . Hypertension   . Arthritis   . Glaucoma    Past Surgical History  Procedure Laterality Date  . Breast surgery      fatty tissue  . Appendectomy    . Bladder surgery  1960  . Stent implant      heart   History   Social History  . Marital Status: Widowed    Spouse Name: N/A    Number of Children: 4  . Years of Education: N/A   Occupational History  . retired    Social History Main Topics  . Smoking status: Never Smoker   . Smokeless tobacco: Never Used  . Alcohol Use: No  . Drug Use: No  . Sexual Activity: No   Other Topics Concern  . Not on file   Social History Narrative   ** Merged History Encounter **       Family History  Problem Relation Age of Onset  . Diabetes Sister   . Cancer Sister     stomach  . Stroke Brother   . Coronary artery disease Mother    Patient Active Problem List   Diagnosis Date Noted  . Syncope 09/17/2013  . Hyperlipidemia 09/14/2013  . Diabetes mellitus due to underlying condition with proliferative diabetic retinopathy without macular edema 06/21/2013  . History of diverticulosis 01/15/2013  . Hydronephrosis of right kidney 04/06/2012  .  Hypercalcemia 03/04/2012  . Elevated serum creatinine 03/04/2012  . Type II or unspecified type diabetes mellitus without mention of complication, uncontrolled 01/30/2012  . CHF (congestive heart failure) 01/30/2012  . Essential hypertension, benign 01/30/2012  . Other and unspecified hyperlipidemia 01/30/2012  . DJD (degenerative joint disease) 01/30/2012  . CAD (coronary artery disease) 01/30/2012  . Glaucoma 01/30/2012  . Osteopenia 01/30/2012   Current Outpatient Prescriptions on File Prior to Visit  Medication Sig Dispense Refill  . amLODipine (NORVASC) 10 MG tablet Take one daily  90 tablet  1  . aspirin 81 MG tablet Take 81 mg by mouth daily.      . carvedilol (COREG) 12.5 MG tablet TAKE TWO TABLETS IN AM AND ONE TABLET IN PM  270 tablet  1  . carvedilol (COREG) 12.5 MG tablet TAKE TWO TABLETS IN AM AND ONE TABLET IN PM  270 tablet  0  . carvedilol (COREG) 25 MG tablet Take 1 tablet (25 mg total) by mouth 2 (two) times daily at 8 am and 10 pm. Take one tablet twice a day (one  in AM and one in PM)  180 tablet  1  . furosemide (LASIX) 40 MG tablet TAKE 1/2 TABLET BY MOUTH DAILY  45 tablet  1  . furosemide (LASIX) 40 MG tablet Take 1/2 tablet daily  45 tablet  1  . glyBURIDE-metformin (GLUCOVANCE) 5-500 MG per tablet TAKE 1 TABLET BY MOUTH 2 (TWO) TIMES DAILY.  180 tablet  1  . glyBURIDE-metformin (GLUCOVANCE) 5-500 MG per tablet Take 1 tablet by mouth 2 (two) times daily.  180 tablet  1  . latanoprost (XALATAN) 0.005 % ophthalmic solution Place 1 drop into both eyes at bedtime.        No current facility-administered medications on file prior to visit.      Review of Systems See HPI    Objective:   Physical Exam  Physical Exam  Nursing note and vitals reviewed.  Constitutional: She is oriented to person, place, and time. She appears well-developed and well-nourished.  HENT:  Head: Normocephalic and atraumatic.  Cardiovascular: Normal rate and regular rhythm. Exam reveals  no gallop and no friction rub.  No murmur heard.  Pulmonary/Chest: Breath sounds normal. She has no wheezes. She has no rales.  Neurological: She is alert and oriented to person, place, and time.  Skin: Skin is warm and dry.  Foot exam no rash no ulceration.   goodd Bialteral pp.  Normal microfilament exam Psychiatric: She has a normal mood and affect. Her behavior is normal.         Assessment & Plan:  DM  Check AIC  Advised not to try fruit juice a night. If sugar low try cracker with cheese or peanut butter   Skin rash  Difficult to think this is med related as she has been on these meds for many years  .  She will have son look at this in a few weeks  HTn   Continue meds    Check K today  Elevated creatiinine L last two levels normallized  See me in 3-4 months

## 2014-06-23 ENCOUNTER — Encounter: Payer: Self-pay | Admitting: *Deleted

## 2014-07-12 DIAGNOSIS — H4011X0 Primary open-angle glaucoma, stage unspecified: Secondary | ICD-10-CM | POA: Diagnosis not present

## 2014-07-12 DIAGNOSIS — H00019 Hordeolum externum unspecified eye, unspecified eyelid: Secondary | ICD-10-CM | POA: Diagnosis not present

## 2014-07-18 IMAGING — CT CT ABD-PELV W/ CM
2 of 5 series · 15 of 46 positions shown, 17 images · IV contrast (APPLIED)
Comparison: Ultrasound the abdomen of 03/06/2012

CLINICAL DATA: Left lower quadrant pain, mild renal insufficiency

CT ABDOMEN AND PELVIS WITH CONTRAST
TECHNIQUE: Multidetector CT imaging of the abdomen and pelvis was
performed following the standard protocol during bolus
administration of intravenous contrast.
Contrast: 100mL OMNIPAQUE IOHEXOL 300 MG/ML  SOLN

[Series 3: abd/pelvis 5.0 b31f · axial · 0.68mm/px · z∈[-396,+10]mm · 12 of 91 slices shown, 14 images]
[im 5/91  soft-tissue]
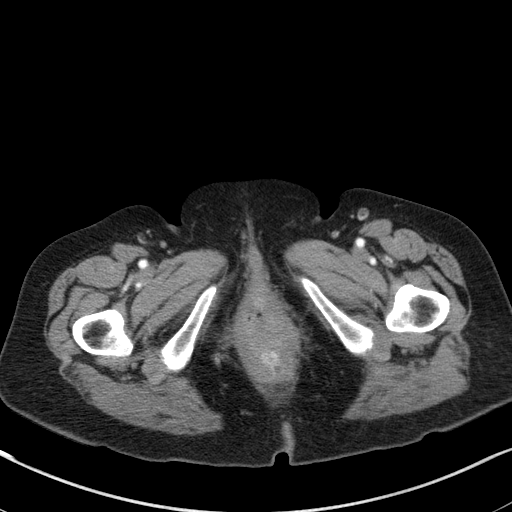
[im 5/91  bone]
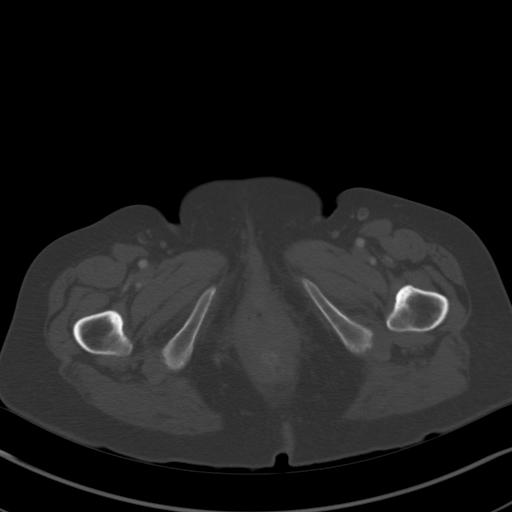
[im 13/91  soft-tissue]
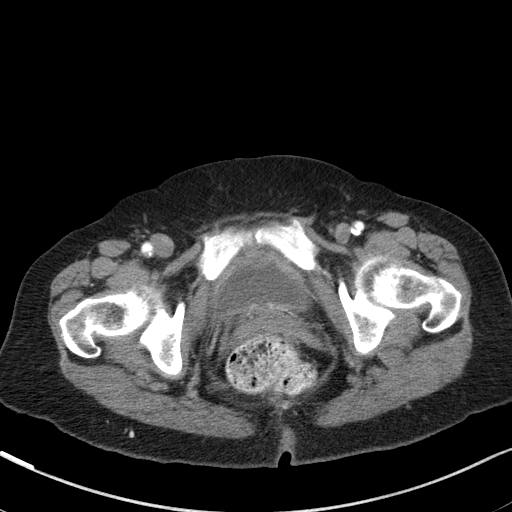
[im 22/91  soft-tissue]
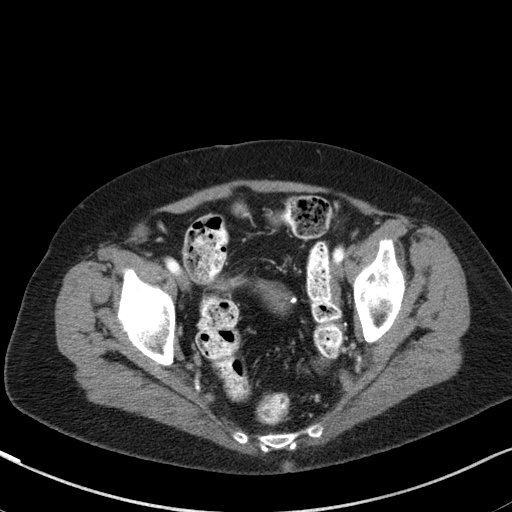
[im 26/91  soft-tissue]
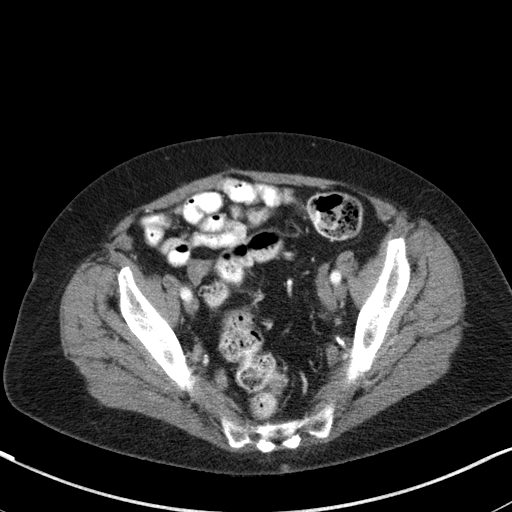
[im 35/91  soft-tissue]
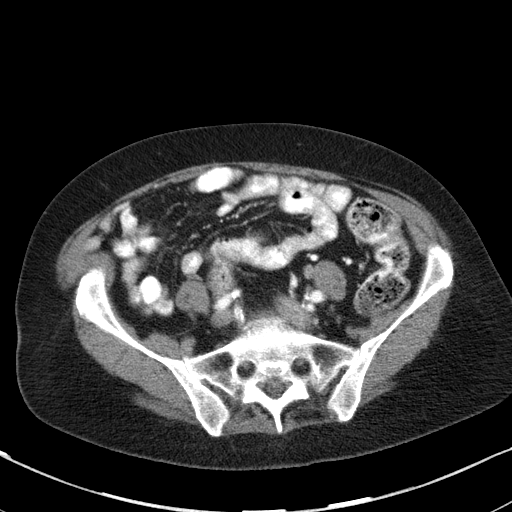
[im 43/91  soft-tissue]
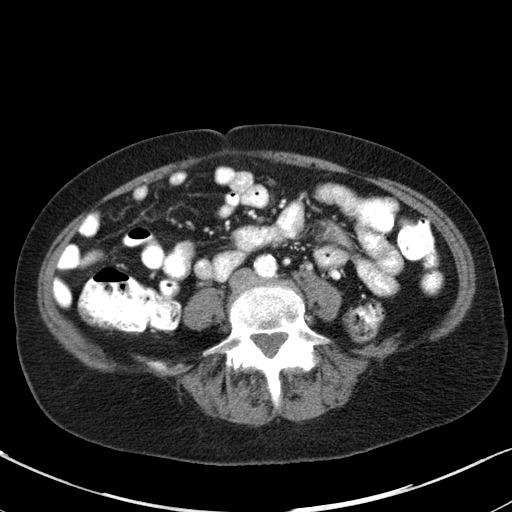
[im 48/91  soft-tissue]
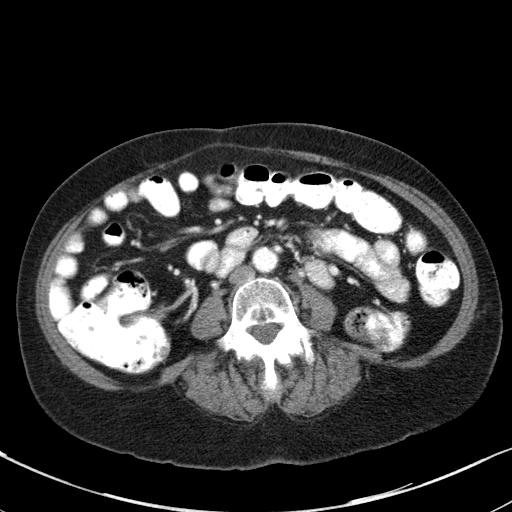
[im 56/91  soft-tissue]
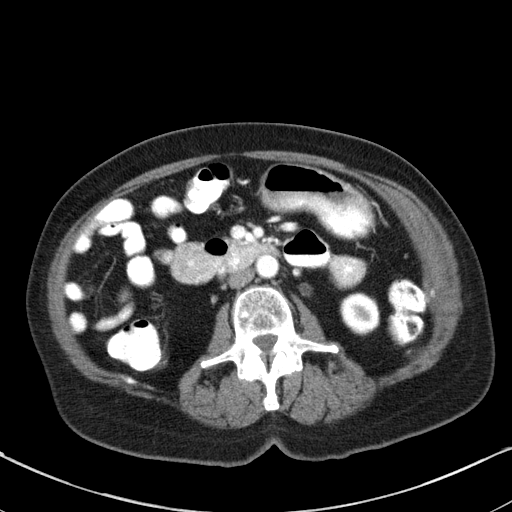
[im 65/91  soft-tissue]
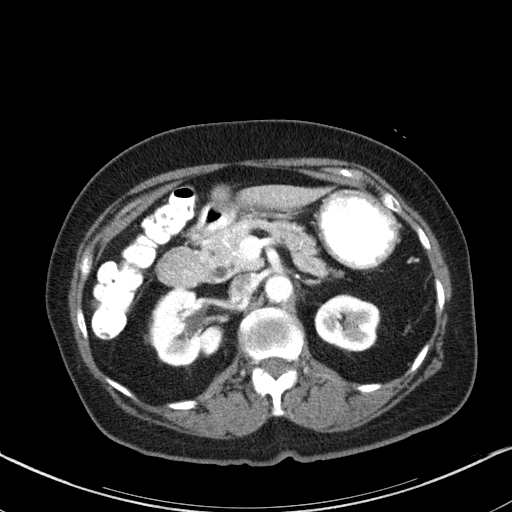
[im 65/91  bone]
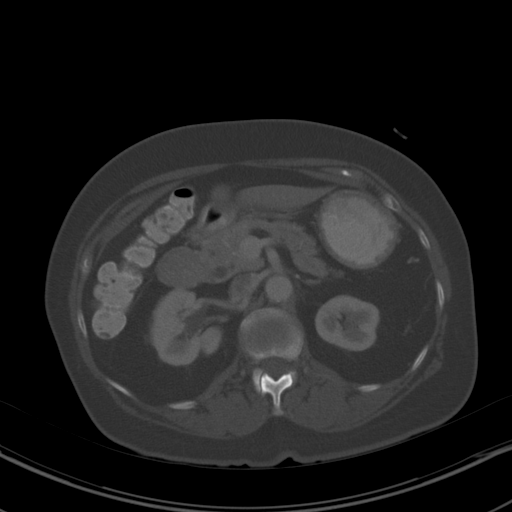
[im 69/91  soft-tissue]
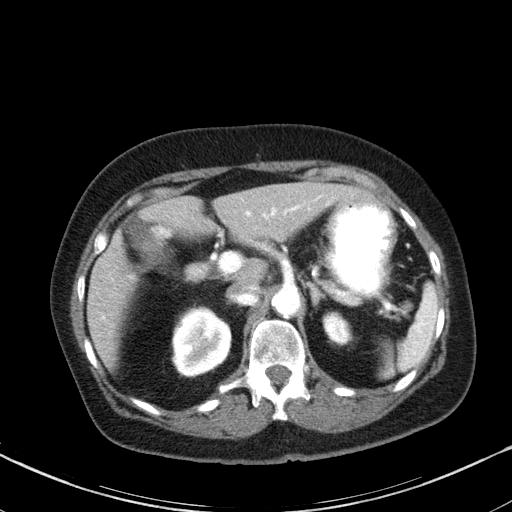
[im 78/91  soft-tissue]
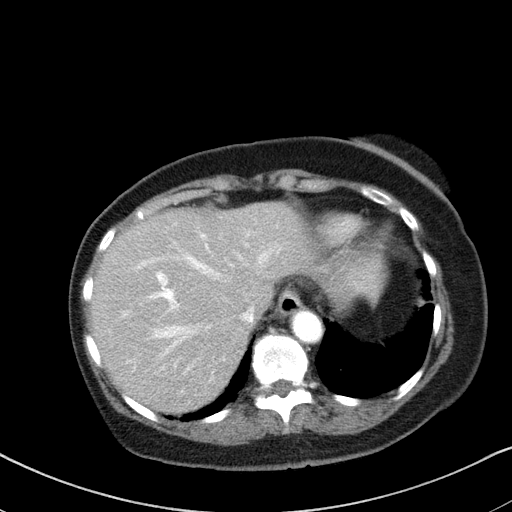
[im 86/91  soft-tissue]
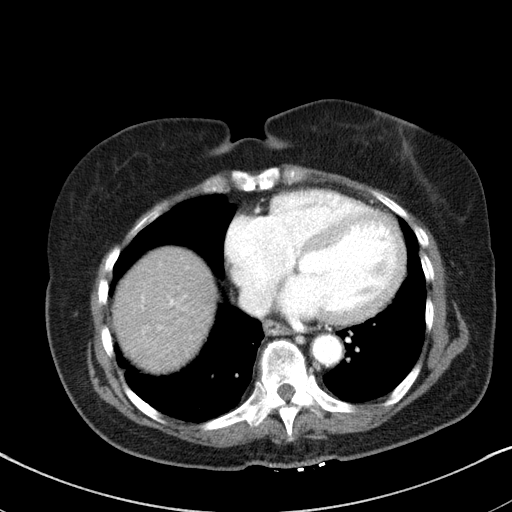

[Series 6: abd/pelvis 3.0 coronal · coronal · 0.75mm/px · 3 of 70 slices shown]
[im 24/70  soft-tissue]
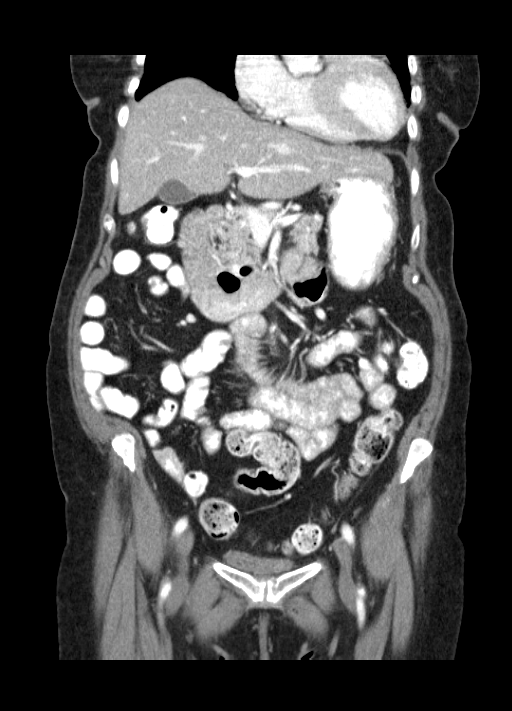
[im 31/70  soft-tissue]
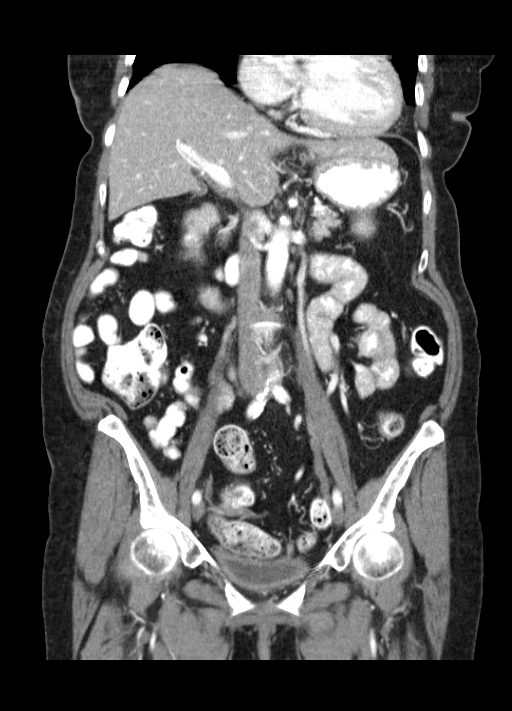
[im 39/70  soft-tissue]
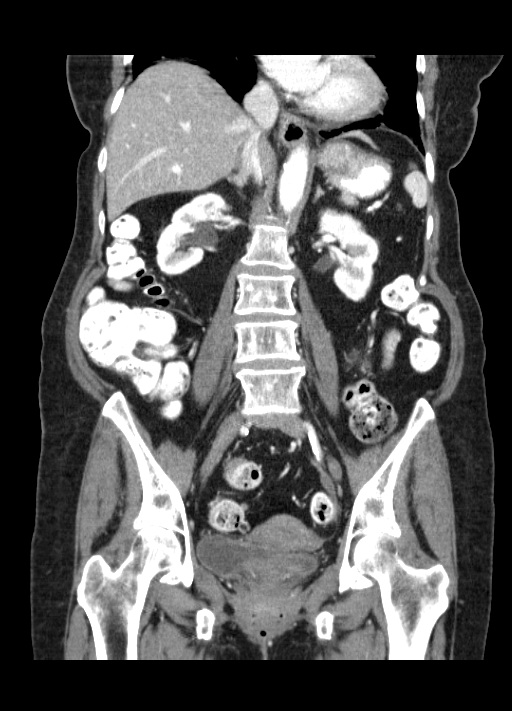

[15 of 46 positions shown; findings below may reference images not displayed]

FINDINGS: The lung bases are clear.  The heart is mildly enlarged.
The liver enhances with no focal abnormality although there may be
fatty infiltration diffusely.  Some sparing near the gallbladder is
noted.  No calcified gallstones are seen. The common bile duct is
slightly prominent measuring 8 mm in diameter near the ampulla and
correlation with liver function tests is recommended.  The pancreas
is normal in size and the pancreatic duct is not dilated. The
adrenal glands and spleen are unremarkable.  The stomach is
moderately distended with contrast with no abnormality noted.  The
kidneys enhance with no calculus or mass and no hydronephrosis is
seen.  The ureters are minimally prominent to the bladder but no
point of obstruction is noted.  The abdominal aorta is normal in
caliber with moderate atheromatous change present.  No adenopathy
is seen.

The urinary bladder is not well distended but no abnormality is
seen.  The uterus is normal in size.  No adnexal lesion is noted.
No free fluid is seen within the pelvis.  A moderate amount of
feces is noted throughout the colon.  However there is no evidence
of diverticulitis.  The terminal ileum is unremarkable.
Degenerative change is noted involving the facet joints of the
lower lumbar spine.
IMPRESSION: .
1.  There is a moderate amount of feces in the colon.  However no
evidence of diverticulitis is seen.  No significant diverticular
formation is noted.
2.  Suspect fatty infiltration of the liver with areas of sparing.
3.  Slightly prominent common bile duct.  Correlate with liver
function tests.

## 2014-08-16 ENCOUNTER — Encounter: Payer: Self-pay | Admitting: Internal Medicine

## 2014-08-22 ENCOUNTER — Other Ambulatory Visit: Payer: Self-pay | Admitting: Internal Medicine

## 2014-08-23 DIAGNOSIS — H4011X2 Primary open-angle glaucoma, moderate stage: Secondary | ICD-10-CM | POA: Diagnosis not present

## 2014-08-23 NOTE — Telephone Encounter (Signed)
Refill request

## 2014-09-20 ENCOUNTER — Ambulatory Visit (INDEPENDENT_AMBULATORY_CARE_PROVIDER_SITE_OTHER): Payer: Medicare Other | Admitting: Internal Medicine

## 2014-09-20 ENCOUNTER — Encounter: Payer: Self-pay | Admitting: Internal Medicine

## 2014-09-20 VITALS — BP 144/80 | HR 70 | Temp 97.8°F | Resp 16 | Ht 63.0 in | Wt 137.0 lb

## 2014-09-20 DIAGNOSIS — Z23 Encounter for immunization: Secondary | ICD-10-CM

## 2014-09-20 DIAGNOSIS — E119 Type 2 diabetes mellitus without complications: Secondary | ICD-10-CM | POA: Diagnosis not present

## 2014-09-20 DIAGNOSIS — I2510 Atherosclerotic heart disease of native coronary artery without angina pectoris: Secondary | ICD-10-CM | POA: Diagnosis not present

## 2014-09-20 LAB — HEMOGLOBIN A1C
Hgb A1c MFr Bld: 7.1 % — ABNORMAL HIGH (ref <5.7)
Mean Plasma Glucose: 157 mg/dL — ABNORMAL HIGH (ref <117)

## 2014-09-20 LAB — BASIC METABOLIC PANEL
BUN: 23 mg/dL (ref 6–23)
CALCIUM: 9.8 mg/dL (ref 8.4–10.5)
CO2: 23 meq/L (ref 19–32)
CREATININE: 1.13 mg/dL — AB (ref 0.50–1.10)
Chloride: 104 mEq/L (ref 96–112)
GLUCOSE: 153 mg/dL — AB (ref 70–99)
Potassium: 4.7 mEq/L (ref 3.5–5.3)
SODIUM: 137 meq/L (ref 135–145)

## 2014-09-20 NOTE — Progress Notes (Signed)
Subjective:    Patient ID: Brenda Spence, female    DOB: July 21, 1927, 80 y.o.   MRN: [id number]  HPI 9/2 DM Check AIC Advised not to try fruit juice a night. If sugar low try cracker with cheese or peanut butter   Skin rash Difficult to think this is med related as she has been on these meds for many years . She will have son look at this in a few weeks  HTn Continue meds Check K today  Elevated creatiinine L last two levels normallized  See me in 3-4 months   TODAY:  Brenda Spence is here for follow up.  Doing well.   Did not take BP med today   No paresthesias  UTD with visual exam  She has two sisters (one in [place]/ one in [place]) who are quite ill and this is upsetting to her  Her sone who is a dermatologist took a biopsy of skin lesion on her right arm and looked at pts rash.      No Known Allergies Past Medical History  Diagnosis Date  . Post-menopausal   . CAD (coronary artery disease) 2006    [place] RCA [place]  . Broken ribs   . CHF (congestive heart failure)   . Diabetes mellitus   . Hyperlipidemia   . Hypertension   . Arthritis   . Glaucoma    Past Surgical History  Procedure Laterality Date  . Breast surgery      fatty tissue  . Appendectomy    . Bladder surgery  1960  . Stent implant      heart   History   Social History  . Marital Status: Widowed    Spouse Name: N/A    Number of Children: 4  . Years of Education: N/A   Occupational History  . retired    Social History Main Topics  . Smoking status: Never Smoker   . Smokeless tobacco: Never Used  . Alcohol Use: No  . Drug Use: No  . Sexual Activity: No   Other Topics Concern  . Not on file   Social History Narrative   ** Merged History Encounter **       Family History  Problem Relation Age of Onset  . Diabetes Sister   . Cancer Sister     stomach  . Stroke Brother   . Coronary artery disease Mother    Patient Active Problem List   Diagnosis Date Noted    . Syncope 09/17/2013  . Hyperlipidemia 09/14/2013  . Diabetes mellitus due to underlying condition with proliferative diabetic retinopathy without macular edema 06/21/2013  . History of diverticulosis 01/15/2013  . Hydronephrosis of right kidney 04/06/2012  . Hypercalcemia 03/04/2012  . Elevated serum creatinine 03/04/2012  . Type II or unspecified type diabetes mellitus without mention of complication, uncontrolled 01/30/2012  . CHF (congestive heart failure) 01/30/2012  . Essential hypertension, benign 01/30/2012  . Other and unspecified hyperlipidemia 01/30/2012  . DJD (degenerative joint disease) 01/30/2012  . CAD (coronary artery disease) 01/30/2012  . Glaucoma 01/30/2012  . Osteopenia 01/30/2012   Current Outpatient Prescriptions on File Prior to Visit  Medication Sig Dispense Refill  . amLODipine (NORVASC) 10 MG tablet Take one daily 90 tablet 1  . aspirin 81 MG tablet Take 81 mg by mouth daily.    . benazepril (LOTENSIN) 20 MG tablet     . carvedilol (COREG) 25 MG tablet Take 1 tablet (25 mg total) by mouth  2 (two) times daily at 8 am and 10 pm. Take one tablet twice a day (one in AM and one in PM) 180 tablet 1  . furosemide (LASIX) 40 MG tablet Take 1/2 tablet daily 45 tablet 1  . furosemide (LASIX) 40 MG tablet TAKE 1/2 TABLET BY MOUTH DAILY 45 tablet 1  . glyBURIDE-metformin (GLUCOVANCE) 5-500 MG per tablet Take 1 tablet by mouth 2 (two) times daily. 180 tablet 1  . latanoprost (XALATAN) 0.005 % ophthalmic solution Place 1 drop into both eyes at bedtime.      No current facility-administered medications on file prior to visit.       Review of Systems See HPI     Objective:   Physical Exam  Physical Exam  Nursing note and vitals reviewed.  Constitutional: She is oriented to person, place, and time. She appears well-developed and well-nourished.  HENT:  Head: Normocephalic and atraumatic.  Cardiovascular: Normal rate and regular rhythm. Exam reveals no gallop  and no friction rub.  No murmur heard.  Pulmonary/Chest: Breath sounds normal. She has no wheezes. She has no rales.  Neurological: She is alert and oriented to person, place, and time.  Skin: Skin is warm and dry.  Foot exam:  Nl microfilament Psychiatric: She has a normal mood and affect. Her behavior is normal.          Assessment & Plan:  DM:   Will check AIC today .   UTD with opthalmologist,  Nl foot exam  UTD with microalbumin  HTN:  Does have white coat syndrome   Advised to take BP med daily   Repeat BP improved my exam  Skin Rash  Recent biopsy by her dermatologist son

## 2014-09-20 NOTE — Patient Instructions (Signed)
See me in 3 months  30 min  To lab today

## 2014-09-22 ENCOUNTER — Telehealth: Payer: Self-pay

## 2014-09-22 NOTE — Telephone Encounter (Signed)
Brenda Spence refused to talk to me about what she needed to talk with Dr .Constance GoltzSchoenhoff about. She said she could only talk with Dr .Constance GoltzSchoenhoff.

## 2014-09-22 NOTE — Telephone Encounter (Signed)
Phineas Incheslthea Pak [phone]  Taiya would like for Dr Constance GoltzSchoenhoff to call her back, she wants to talk to her about her back pain.

## 2014-09-24 NOTE — Telephone Encounter (Signed)
Spoke with pt who states a back brace company keeps calling trying to sell her a back brace  She has never reported any back problems to me but pt states she will discuss this at her next visit with me. Not a new issue  She has long standing back discomfort when standing for long periods

## 2014-09-30 ENCOUNTER — Other Ambulatory Visit: Payer: Self-pay | Admitting: Internal Medicine

## 2014-09-30 ENCOUNTER — Telehealth: Payer: Self-pay | Admitting: *Deleted

## 2014-09-30 NOTE — Telephone Encounter (Signed)
-----   Message from Kendrick Rancheborah D Schoenhoff, MD sent at 09/29/2014  3:40 PM EST ----- Brenda Spence  Call pt and let her know that her AIC is jsut a touch higher.  ADvise to just be careful how much sweets she has over holidays

## 2014-09-30 NOTE — Telephone Encounter (Signed)
Refill request

## 2014-09-30 NOTE — Telephone Encounter (Signed)
I spoke with Brenda Spence and gave her lab results. She will work on her sugar and carb intake. -eh

## 2014-10-15 DIAGNOSIS — R1310 Dysphagia, unspecified: Secondary | ICD-10-CM

## 2014-10-15 HISTORY — DX: Dysphagia, unspecified: R13.10

## 2014-12-13 ENCOUNTER — Ambulatory Visit: Payer: Medicare Other | Admitting: Internal Medicine

## 2014-12-20 ENCOUNTER — Telehealth: Payer: Self-pay | Admitting: *Deleted

## 2014-12-20 ENCOUNTER — Other Ambulatory Visit: Payer: Self-pay | Admitting: *Deleted

## 2014-12-20 ENCOUNTER — Ambulatory Visit (INDEPENDENT_AMBULATORY_CARE_PROVIDER_SITE_OTHER): Payer: Medicare Other | Admitting: Internal Medicine

## 2014-12-20 ENCOUNTER — Other Ambulatory Visit: Payer: Self-pay | Admitting: Internal Medicine

## 2014-12-20 ENCOUNTER — Encounter: Payer: Self-pay | Admitting: Internal Medicine

## 2014-12-20 ENCOUNTER — Ambulatory Visit (HOSPITAL_BASED_OUTPATIENT_CLINIC_OR_DEPARTMENT_OTHER)
Admission: RE | Admit: 2014-12-20 | Discharge: 2014-12-20 | Disposition: A | Discharge status: Home / Self Care | Payer: Medicare Other | Source: Ambulatory Visit | Attending: Internal Medicine | Admitting: Internal Medicine

## 2014-12-20 VITALS — BP 136/60 | HR 65 | Resp 16 | Ht 63.0 in | Wt 138.0 lb

## 2014-12-20 DIAGNOSIS — M5136 Other intervertebral disc degeneration, lumbar region: Secondary | ICD-10-CM | POA: Diagnosis not present

## 2014-12-20 DIAGNOSIS — D649 Anemia, unspecified: Secondary | ICD-10-CM | POA: Diagnosis not present

## 2014-12-20 DIAGNOSIS — M47896 Other spondylosis, lumbar region: Secondary | ICD-10-CM | POA: Diagnosis not present

## 2014-12-20 DIAGNOSIS — I10 Essential (primary) hypertension: Secondary | ICD-10-CM

## 2014-12-20 DIAGNOSIS — E119 Type 2 diabetes mellitus without complications: Secondary | ICD-10-CM

## 2014-12-20 DIAGNOSIS — M47897 Other spondylosis, lumbosacral region: Secondary | ICD-10-CM | POA: Diagnosis not present

## 2014-12-20 DIAGNOSIS — M545 Low back pain: Secondary | ICD-10-CM

## 2014-12-20 DIAGNOSIS — Z0000 Encounter for general adult medical examination without abnormal findings: Secondary | ICD-10-CM | POA: Diagnosis not present

## 2014-12-20 LAB — COMPLETE METABOLIC PANEL WITH GFR
ALT: 12 U/L (ref 0–35)
AST: 19 U/L (ref 0–37)
Albumin: 4 g/dL (ref 3.5–5.2)
Alkaline Phosphatase: 67 U/L (ref 39–117)
BILIRUBIN TOTAL: 0.5 mg/dL (ref 0.2–1.2)
BUN: 27 mg/dL — ABNORMAL HIGH (ref 6–23)
CO2: 27 mEq/L (ref 19–32)
Calcium: 9.6 mg/dL (ref 8.4–10.5)
Chloride: 100 mEq/L (ref 96–112)
Creat: 1.36 mg/dL — ABNORMAL HIGH (ref 0.50–1.10)
GFR, Est African American: 41 mL/min — ABNORMAL LOW
GFR, Est Non African American: 36 mL/min — ABNORMAL LOW
GLUCOSE: 196 mg/dL — AB (ref 70–99)
Potassium: 4.7 mEq/L (ref 3.5–5.3)
SODIUM: 135 meq/L (ref 135–145)
Total Protein: 7.1 g/dL (ref 6.0–8.3)

## 2014-12-20 LAB — CBC WITH DIFFERENTIAL/PLATELET
Basophils Absolute: 0.1 10*3/uL (ref 0.0–0.1)
Basophils Relative: 1 % (ref 0–1)
EOS ABS: 0.3 10*3/uL (ref 0.0–0.7)
Eosinophils Relative: 6 % — ABNORMAL HIGH (ref 0–5)
HCT: 33.7 % — ABNORMAL LOW (ref 36.0–46.0)
Hemoglobin: 10.9 g/dL — ABNORMAL LOW (ref 12.0–15.0)
Lymphocytes Relative: 31 % (ref 12–46)
Lymphs Abs: 1.6 10*3/uL (ref 0.7–4.0)
MCH: 30.5 pg (ref 26.0–34.0)
MCHC: 32.3 g/dL (ref 30.0–36.0)
MCV: 94.4 fL (ref 78.0–100.0)
MPV: 10.4 fL (ref 8.6–12.4)
Monocytes Absolute: 0.3 10*3/uL (ref 0.1–1.0)
Monocytes Relative: 6 % (ref 3–12)
Neutro Abs: 2.9 10*3/uL (ref 1.7–7.7)
Neutrophils Relative %: 56 % (ref 43–77)
Platelets: 284 10*3/uL (ref 150–400)
RBC: 3.57 MIL/uL — ABNORMAL LOW (ref 3.87–5.11)
RDW: 12.5 % (ref 11.5–15.5)
WBC: 5.2 10*3/uL (ref 4.0–10.5)

## 2014-12-20 LAB — HEMOGLOBIN A1C
Hgb A1c MFr Bld: 6.7 % — ABNORMAL HIGH (ref <5.7)
Mean Plasma Glucose: 146 mg/dL — ABNORMAL HIGH (ref <117)

## 2014-12-20 LAB — LIPID PANEL
Cholesterol: 242 mg/dL — ABNORMAL HIGH (ref 0–200)
HDL: 42 mg/dL — ABNORMAL LOW (ref >=46)
LDL Cholesterol: 169 mg/dL — ABNORMAL HIGH (ref 0–99)
Total CHOL/HDL Ratio: 5.8 Ratio
Triglycerides: 153 mg/dL — ABNORMAL HIGH (ref <150)
VLDL: 31 mg/dL (ref 0–40)

## 2014-12-20 LAB — TSH: TSH: 1.07 u[IU]/mL (ref 0.350–4.500)

## 2014-12-20 NOTE — Telephone Encounter (Signed)
Brenda Spence is aware of her x ray results.-eh

## 2014-12-20 NOTE — Progress Notes (Addendum)
Subjective:    Patient ID: Brenda Spence, female    DOB: 11-30-1926, 81 y.o.   MRN: [id number]  HPI  06/2014 note Assessment & Plan:  DM Check AIC Advised not to try fruit juice a night. If sugar low try cracker with cheese or peanut butter   Skin rash Difficult to think this is med related as she has been on these meds for many years . She will have son look at this in a few weeks  HTn Continue meds Check K today  Elevated creatiinine L last two levels normallized  See me in 3-4 months       TODAY   Brenda Spence is here for DM  Follow up  Rare paresthesia  In left foot only.  UTD with eye exam   Reports L/S spine pain  No radiation   Hurts only when bending forward.  Feels stiff in am then improves  No Known Allergies Past Medical History  Diagnosis Date  . Post-menopausal   . CAD (coronary artery disease) 2006    [place]  . Broken ribs   . CHF (congestive heart failure)   . Diabetes mellitus   . Hyperlipidemia   . Hypertension   . Arthritis   . Glaucoma    Past Surgical History  Procedure Laterality Date  . Breast surgery      fatty tissue  . Appendectomy    . Bladder surgery  1960  . Stent implant      heart   History   Social History  . Marital Status: Widowed    Spouse Name: N/A  . Number of Children: 4  . Years of Education: N/A   Occupational History  . retired    Social History Main Topics  . Smoking status: Never Smoker   . Smokeless tobacco: Never Used  . Alcohol Use: No  . Drug Use: No  . Sexual Activity: No   Other Topics Concern  . Not on file   Social History Narrative   ** Merged History Encounter **       Family History  Problem Relation Age of Onset  . Diabetes Sister   . Cancer Sister     stomach  . Stroke Brother   . Coronary artery disease Mother    Patient Active Problem List   Diagnosis Date Noted  . Syncope 09/17/2013  . Hyperlipidemia 09/14/2013  . Diabetes mellitus due to  underlying condition with proliferative diabetic retinopathy without macular edema 06/21/2013  . History of diverticulosis 01/15/2013  . Hydronephrosis of right kidney 04/06/2012  . Hypercalcemia 03/04/2012  . Elevated serum creatinine 03/04/2012  . Type II or unspecified type diabetes mellitus without mention of complication, uncontrolled 01/30/2012  . CHF (congestive heart failure) 01/30/2012  . Essential hypertension, benign 01/30/2012  . Other and unspecified hyperlipidemia 01/30/2012  . DJD (degenerative joint disease) 01/30/2012  . CAD (coronary artery disease) 01/30/2012  . Glaucoma 01/30/2012  . Osteopenia 01/30/2012   Current Outpatient Prescriptions on File Prior to Visit  Medication Sig Dispense Refill  . amLODipine (NORVASC) 10 MG tablet Take one daily 90 tablet 1  . aspirin 81 MG tablet Take 81 mg by mouth daily.    . benazepril (LOTENSIN) 20 MG tablet     . carvedilol (COREG) 12.5 MG tablet TAKE 2 TABLETS BY MOUTH EVERY MORNING AND 1 TABLET IN THE EVENING 270 tablet 0  . carvedilol (COREG) 25 MG tablet Take 1 tablet (25 mg  total) by mouth 2 (two) times daily at 8 am and 10 pm. Take one tablet twice a day (one in AM and one in PM) 180 tablet 1  . furosemide (LASIX) 40 MG tablet Take 1/2 tablet daily 45 tablet 1  . glyBURIDE-metformin (GLUCOVANCE) 5-500 MG per tablet Take 1 tablet by mouth 2 (two) times daily. 180 tablet 1  . latanoprost (XALATAN) 0.005 % ophthalmic solution Place 1 drop into both eyes at bedtime.      No current facility-administered medications on file prior to visit.      Review of Systems    see HPI Objective:   Physical Exam Physical Exam  Nursing note and vitals reviewed.  Constitutional: She is oriented to person, place, and time. She appears well-developed and well-nourished.  HENT:  Head: Normocephalic and atraumatic.  Cardiovascular: Normal rate and regular rhythm. Exam reveals no gallop and no friction rub.  No murmur heard.    Pulmonary/Chest: Breath sounds normal. She has no wheezes. She has no rales.  Neurological: She is alert and oriented to person, place, and time.  LE  Reflexes 2+ bilaterall SLR neg bilaterally Motor LE 5/5 and symmetric Foot exam  Strong bilateral PP  Normal sensation ot microfilament  No rashes Skin: Skin is warm and dry.  Psychiatric: She has a normal mood and affect. Her behavior is normal.              Assessment & Plan:  DM  Will get all labs ,  Micral today  Continue meds  Low back pain  Will get plain films today .  OK for ES tylenol  One bid  Prn   HTN  Continue meds     To lab and xray today   Addendum  3/10  Spoke with pt as she has new onset anemia   With low ferriten .  Pt reports chronic diarrhea but no change in color of stool  Will refer to GI for endocsopy  Rx daily FE  Low vitamin D will supplement  .  Hyperlipidemia she repeatedly declines statins due to SE profile

## 2014-12-20 NOTE — Telephone Encounter (Signed)
-----   Message from Kendrick Rancheborah D Schoenhoff, MD sent at 12/20/2014  1:13 PM EST ----- Brenda Spence  Call pt and let her know that her xray shows arthritis. Ok to take one Extra strenght Tylenol  Bid

## 2014-12-21 ENCOUNTER — Telehealth: Payer: Self-pay | Admitting: Internal Medicine

## 2014-12-21 LAB — IRON AND TIBC
%SAT: 31 % (ref 20–55)
IRON: 109 ug/dL (ref 42–145)
TIBC: 349 ug/dL (ref 250–470)
UIBC: 240 ug/dL (ref 125–400)

## 2014-12-21 LAB — VITAMIN D 25 HYDROXY (VIT D DEFICIENCY, FRACTURES): Vit D, 25-Hydroxy: 15 ng/mL — ABNORMAL LOW (ref 30–100)

## 2014-12-21 LAB — MICROALBUMIN / CREATININE URINE RATIO
Creatinine, Urine: 31.4 mg/dL
MICROALB UR: 12.6 mg/dL — AB (ref <2.0)
Microalb Creat Ratio: 401.3 mg/g — ABNORMAL HIGH (ref 0.0–30.0)

## 2014-12-21 LAB — VITAMIN B12: Vitamin B-12: 453 pg/mL (ref 211–911)

## 2014-12-21 LAB — FERRITIN: FERRITIN: 27 ng/mL (ref 10–291)

## 2014-12-21 LAB — FOLATE: Folate: 10.8 ng/mL

## 2014-12-21 NOTE — Telephone Encounter (Signed)
Attempted to call pt at home and mobile.  No answer at home.   Mobile  States  "person not accepting calls"

## 2014-12-21 NOTE — Progress Notes (Signed)
Called lab and added additional labs- eh

## 2014-12-22 ENCOUNTER — Telehealth: Payer: Self-pay | Admitting: Internal Medicine

## 2014-12-23 ENCOUNTER — Telehealth: Payer: Self-pay | Admitting: Internal Medicine

## 2014-12-23 DIAGNOSIS — D508 Other iron deficiency anemias: Secondary | ICD-10-CM | POA: Insufficient documentation

## 2014-12-23 DIAGNOSIS — E559 Vitamin D deficiency, unspecified: Secondary | ICD-10-CM | POA: Insufficient documentation

## 2014-12-23 LAB — RETICULOCYTES
ABS Retic: 43.1 10*3/uL (ref 19.0–186.0)
RBC.: 3.59 MIL/uL — ABNORMAL LOW (ref 3.87–5.11)
Retic Ct Pct: 1.2 % (ref 0.4–2.3)

## 2014-12-23 NOTE — Telephone Encounter (Signed)
Left message for Dr. Callie FieldingMoses Stockert to call office for update of mother's labs

## 2014-12-23 NOTE — Telephone Encounter (Signed)
Spoke with pt and informed of lab results showing anemia and low ferrtien.  She denies change in color of stool but is having diarrhea  No N/V or abd pain  -  Will refer to GI for endoscopic eval .    She requests a GI [place] in [place].   [place]   Start FE supplement daily   Lipids high but pt repeatedly states she does not want statin   Vitamin D is low will replace

## 2014-12-28 ENCOUNTER — Telehealth: Payer: Self-pay | Admitting: *Deleted

## 2014-12-28 ENCOUNTER — Other Ambulatory Visit: Payer: Self-pay | Admitting: Internal Medicine

## 2014-12-28 ENCOUNTER — Telehealth: Payer: Self-pay | Admitting: Internal Medicine

## 2014-12-28 DIAGNOSIS — E559 Vitamin D deficiency, unspecified: Secondary | ICD-10-CM

## 2014-12-28 MED ORDER — VITAMIN D (ERGOCALCIFEROL) 1.25 MG (50000 UNIT) PO CAPS: 50000.0000 [IU] | ORAL_CAPSULE | ORAL | Status: DC

## 2014-12-28 NOTE — Telephone Encounter (Signed)
Left message again for Dr. Callie [facility] Drapeau regarding his mother's lab results

## 2014-12-28 NOTE — Telephone Encounter (Signed)
Pt called in wanting Vit D script [facility] - sent script per request

## 2014-12-29 ENCOUNTER — Telehealth: Payer: Self-pay | Admitting: Internal Medicine

## 2014-12-29 NOTE — Telephone Encounter (Signed)
Refill request

## 2014-12-29 NOTE — Telephone Encounter (Signed)
Brenda Spence  Call pt and clarify that this medication should be two tablets in am and two tablets in the afternoon  Schedule OV with me and let her know that I want to do a hemocult here in the office to check to see if any blood in her stool

## 2014-12-29 NOTE — Telephone Encounter (Signed)
Left voicemail X2 for Dr. Ninfa MeekerElam about his mother to call office

## 2015-01-04 ENCOUNTER — Telehealth: Payer: Self-pay | Admitting: Internal Medicine

## 2015-01-04 ENCOUNTER — Telehealth: Payer: Self-pay | Admitting: *Deleted

## 2015-01-04 NOTE — Telephone Encounter (Signed)
Spoke with pt .  And advised her to come in for hemocult and rectal exam in office but she declines.  She has upcoming appt with GI MD in [place] Dr. Iona CoachGroh on 4/18   See lipid  Informed of results.  She has a Pravachol RX that cardiologist gave her that is expired.  ADvised to call cardiology office and make appt.

## 2015-01-04 NOTE — Telephone Encounter (Signed)
Brenda Spence has an appointment with Dr[facility] Jennell CornerGroah at [facility] on 01/31/15

## 2015-01-06 NOTE — Telephone Encounter (Signed)
error 

## 2015-01-17 ENCOUNTER — Telehealth: Payer: Self-pay | Admitting: Internal Medicine

## 2015-01-17 NOTE — Telephone Encounter (Signed)
Spoke with Pt son Dr. Callie [facility] Mulhearn and updated him regarding mothers anemia  .   She Has an appt with [facility] later this month.  Son states he will call Mom and make sure she keeps appt

## 2015-01-18 ENCOUNTER — Encounter: Payer: Self-pay | Admitting: *Deleted

## 2015-01-31 DIAGNOSIS — D649 Anemia, unspecified: Secondary | ICD-10-CM | POA: Diagnosis not present

## 2015-02-21 DIAGNOSIS — H4011X2 Primary open-angle glaucoma, moderate stage: Secondary | ICD-10-CM | POA: Diagnosis not present

## 2015-02-21 DIAGNOSIS — H5201 Hypermetropia, right eye: Secondary | ICD-10-CM | POA: Diagnosis not present

## 2015-02-21 DIAGNOSIS — E119 Type 2 diabetes mellitus without complications: Secondary | ICD-10-CM | POA: Diagnosis not present

## 2015-02-21 DIAGNOSIS — H524 Presbyopia: Secondary | ICD-10-CM | POA: Diagnosis not present

## 2015-02-21 DIAGNOSIS — H5212 Myopia, left eye: Secondary | ICD-10-CM | POA: Diagnosis not present

## 2015-02-21 LAB — HM DIABETES EYE EXAM

## 2015-02-22 ENCOUNTER — Other Ambulatory Visit: Payer: Self-pay | Admitting: Internal Medicine

## 2015-02-22 ENCOUNTER — Encounter: Payer: Self-pay | Admitting: *Deleted

## 2015-02-22 NOTE — Telephone Encounter (Signed)
Brenda Spence  I a getting a notification that these meds are a duplicate  .  Call her pharmacy and check if she has enough refills   She has appt on 6/1 with Dr. Laury AxonLowne across the hall

## 2015-02-22 NOTE — Telephone Encounter (Signed)
I called and spoke with the pharmacist and got the refills taken care of -eh

## 2015-02-22 NOTE — Telephone Encounter (Signed)
Refill request

## 2015-02-23 ENCOUNTER — Other Ambulatory Visit: Payer: Self-pay | Admitting: Family Medicine

## 2015-02-23 DIAGNOSIS — Z1231 Encounter for screening mammogram for malignant neoplasm of breast: Secondary | ICD-10-CM

## 2015-02-24 ENCOUNTER — Other Ambulatory Visit: Payer: Self-pay | Admitting: Internal Medicine

## 2015-03-07 ENCOUNTER — Ambulatory Visit: Payer: Medicare Other | Admitting: Internal Medicine

## 2015-03-08 DIAGNOSIS — D649 Anemia, unspecified: Secondary | ICD-10-CM | POA: Diagnosis not present

## 2015-03-08 DIAGNOSIS — I129 Hypertensive chronic kidney disease with stage 1 through stage 4 chronic kidney disease, or unspecified chronic kidney disease: Secondary | ICD-10-CM | POA: Diagnosis not present

## 2015-03-08 DIAGNOSIS — E1129 Type 2 diabetes mellitus with other diabetic kidney complication: Secondary | ICD-10-CM | POA: Diagnosis not present

## 2015-03-08 DIAGNOSIS — N183 Chronic kidney disease, stage 3 (moderate): Secondary | ICD-10-CM | POA: Diagnosis not present

## 2015-03-10 ENCOUNTER — Other Ambulatory Visit: Payer: Self-pay | Admitting: Internal Medicine

## 2015-03-15 ENCOUNTER — Ambulatory Visit (HOSPITAL_BASED_OUTPATIENT_CLINIC_OR_DEPARTMENT_OTHER)
Admission: RE | Admit: 2015-03-15 | Discharge: 2015-03-15 | Disposition: A | Discharge status: Home / Self Care | Payer: Medicare Other | Source: Ambulatory Visit | Attending: Family Medicine | Admitting: Family Medicine

## 2015-03-15 DIAGNOSIS — Z1231 Encounter for screening mammogram for malignant neoplasm of breast: Secondary | ICD-10-CM | POA: Diagnosis not present

## 2015-03-16 ENCOUNTER — Telehealth: Payer: Self-pay | Admitting: *Deleted

## 2015-03-16 ENCOUNTER — Encounter: Payer: Self-pay | Admitting: *Deleted

## 2015-03-16 NOTE — Telephone Encounter (Signed)
Pre-Visit Call completed with patient and chart updated.   Pre-Visit Info documented in Specialty Comments under [employer].    

## 2015-03-17 ENCOUNTER — Ambulatory Visit (INDEPENDENT_AMBULATORY_CARE_PROVIDER_SITE_OTHER): Payer: Medicare Other | Admitting: Family Medicine

## 2015-03-17 ENCOUNTER — Encounter: Payer: Self-pay | Admitting: Family Medicine

## 2015-03-17 VITALS — BP 146/84 | HR 67 | Temp 97.7°F | Resp 16 | Ht 63.5 in | Wt 138.0 lb

## 2015-03-17 DIAGNOSIS — I10 Essential (primary) hypertension: Secondary | ICD-10-CM

## 2015-03-17 DIAGNOSIS — E785 Hyperlipidemia, unspecified: Secondary | ICD-10-CM | POA: Diagnosis not present

## 2015-03-17 DIAGNOSIS — E1165 Type 2 diabetes mellitus with hyperglycemia: Secondary | ICD-10-CM | POA: Diagnosis not present

## 2015-03-17 DIAGNOSIS — R8290 Unspecified abnormal findings in urine: Secondary | ICD-10-CM

## 2015-03-17 DIAGNOSIS — IMO0002 Reserved for concepts with insufficient information to code with codable children: Secondary | ICD-10-CM

## 2015-03-17 DIAGNOSIS — R8299 Other abnormal findings in urine: Secondary | ICD-10-CM | POA: Diagnosis not present

## 2015-03-17 DIAGNOSIS — E119 Type 2 diabetes mellitus without complications: Secondary | ICD-10-CM

## 2015-03-17 LAB — POCT URINALYSIS DIPSTICK
Bilirubin, UA: NEGATIVE
Blood, UA: NEGATIVE
Glucose, UA: NEGATIVE
Ketones, UA: NEGATIVE
Nitrite, UA: NEGATIVE
SPEC GRAV UA: 1.01
UROBILINOGEN UA: 0.2
pH, UA: 6

## 2015-03-17 IMAGING — US US CAROTID DUPLEX BILAT
1 series · 13 of 24 positions shown · non-contrast
Comparison: None.

CLINICAL DATA: CVA.  Recent falls.

EXAM:
BILATERAL CAROTID DUPLEX ULTRASOUND
TECHNIQUE: Gray scale imaging, color Doppler and duplex ultrasound were
performed of bilateral carotid and vertebral arteries in the neck.

[Series 1: us carotid duplex bilat · 0.08mm/px · 13 of 80 slices shown]
[im 1/80]
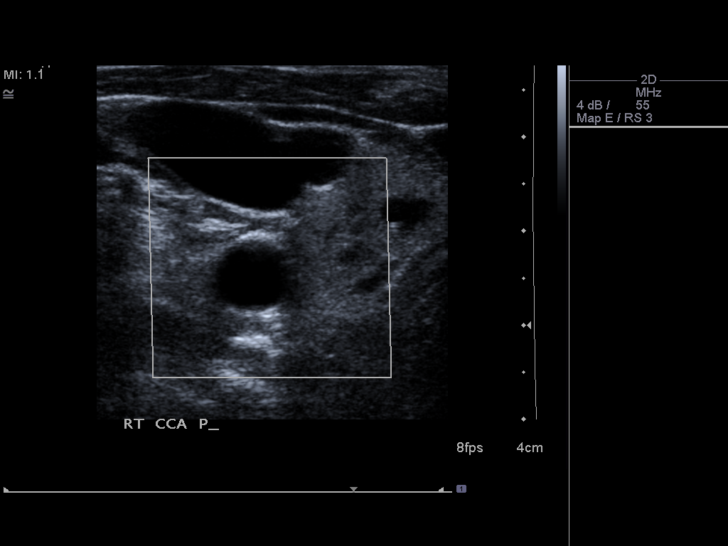
[im 7/80]
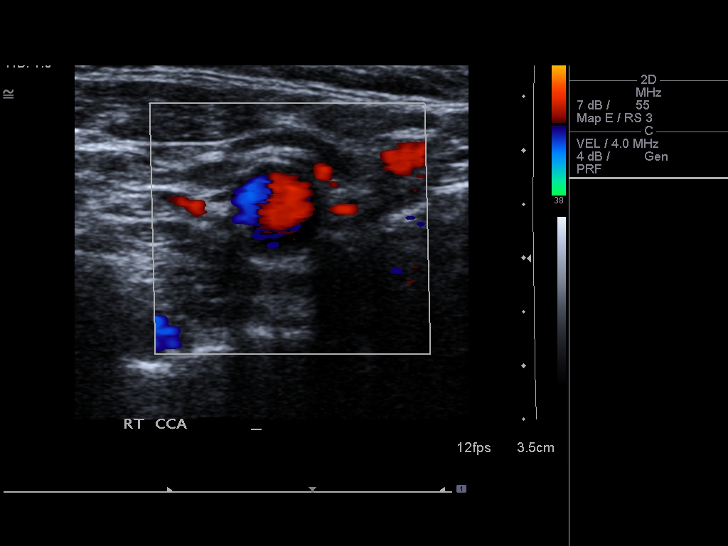
[im 14/80]
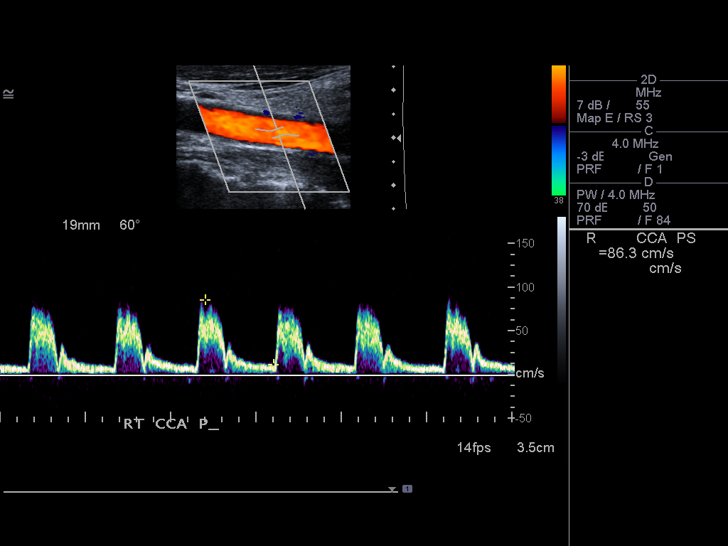
[im 21/80]
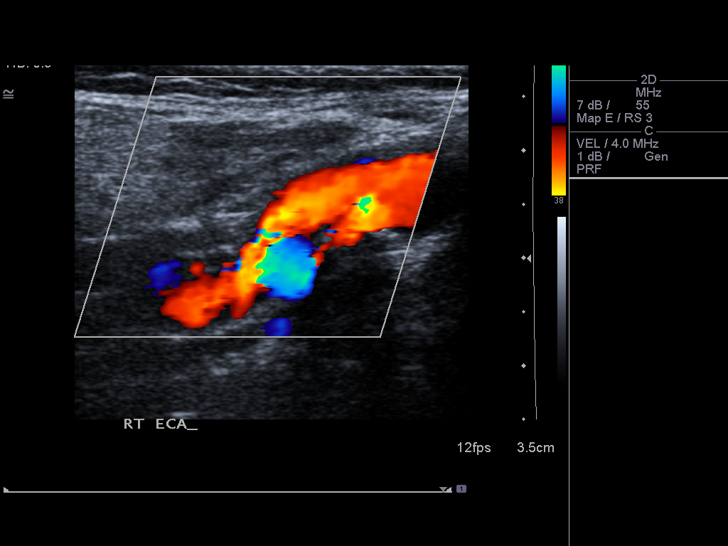
[im 28/80]
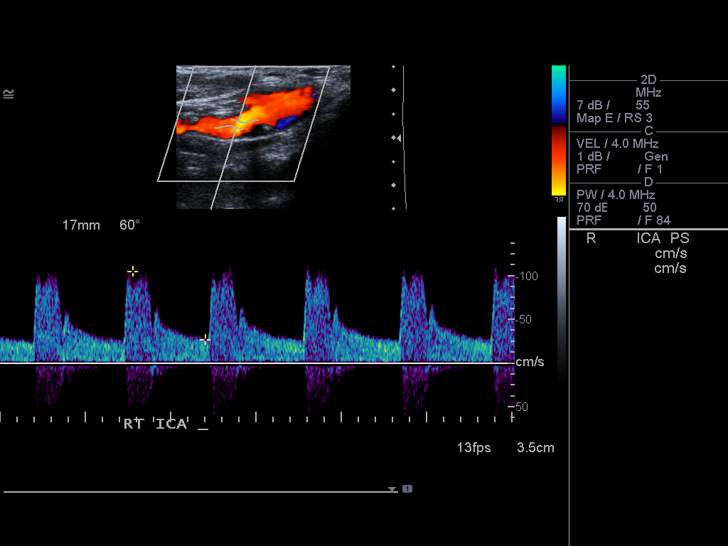
[im 35/80]
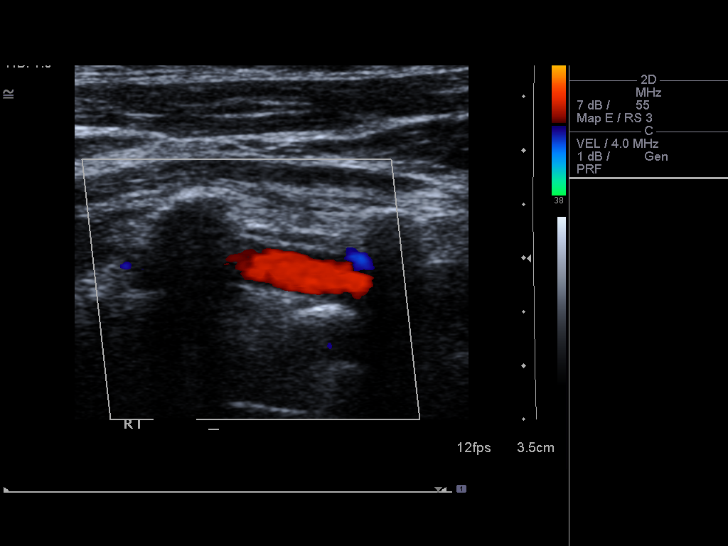
[im 42/80]
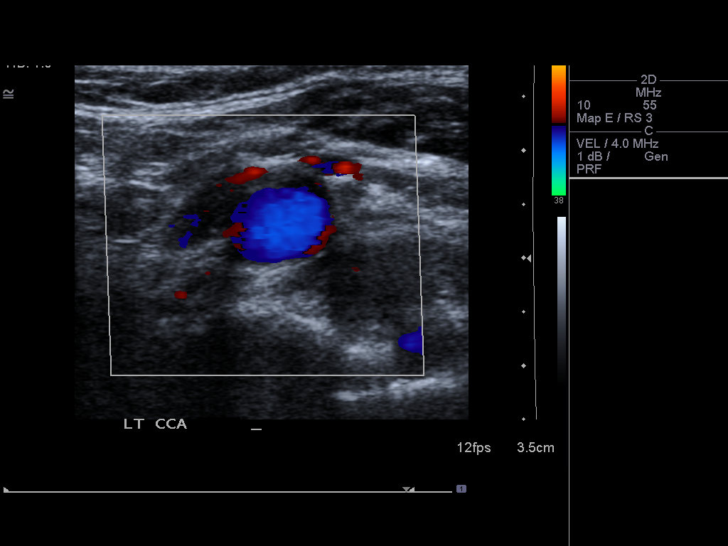
[im 45/80]
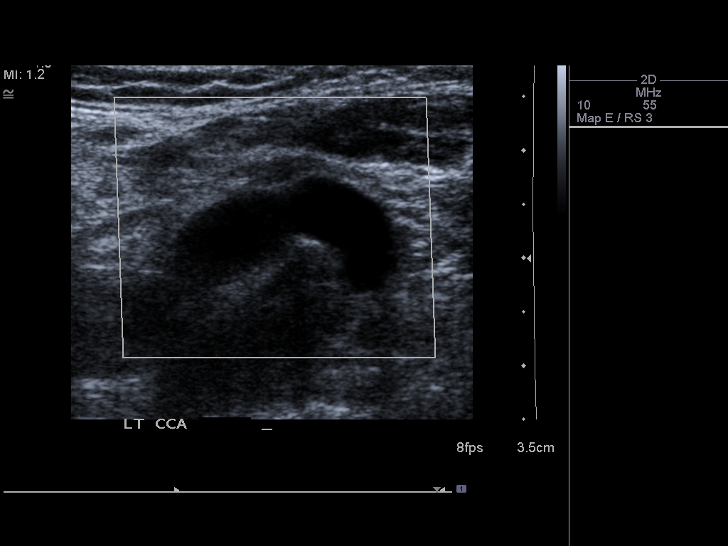
[im 52/80]
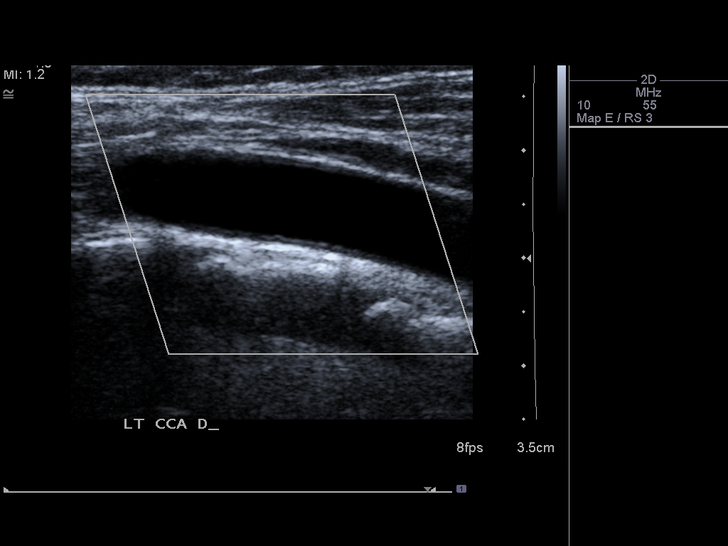
[im 59/80]
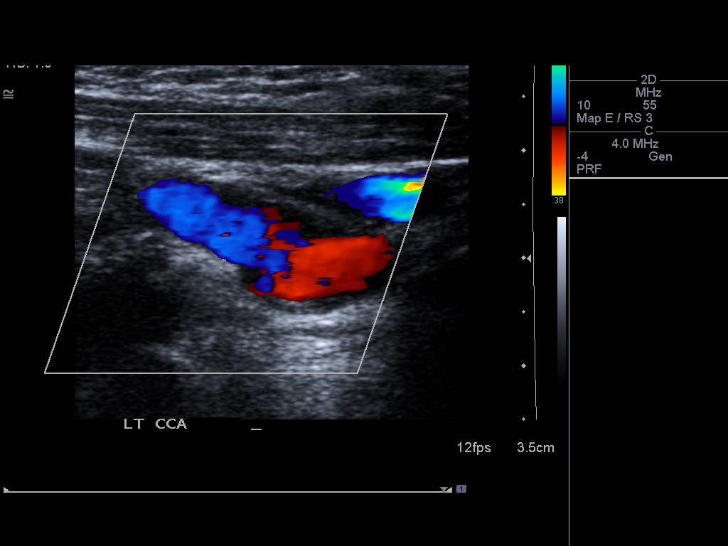
[im 66/80]
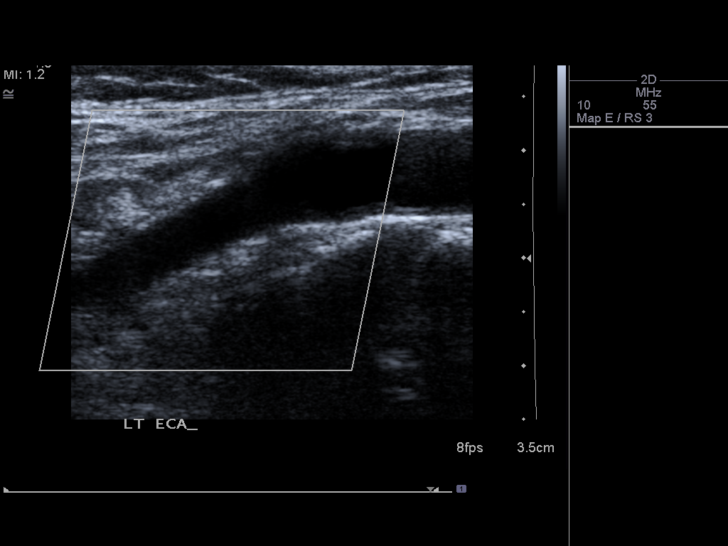
[im 73/80]
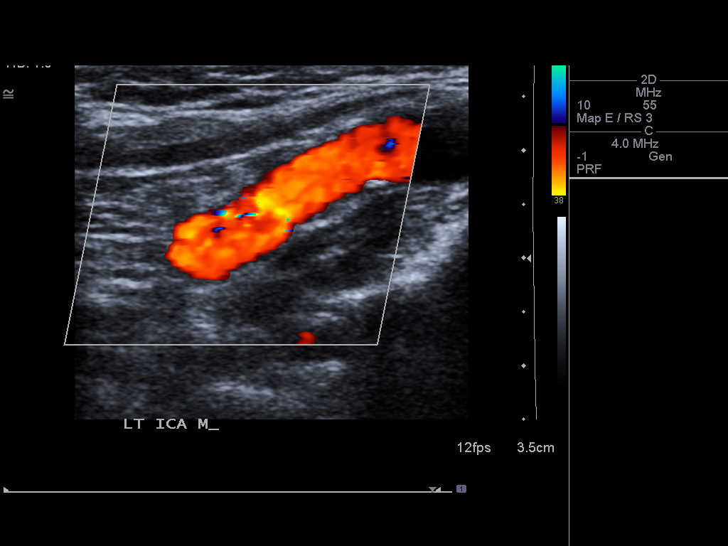
[im 80/80]
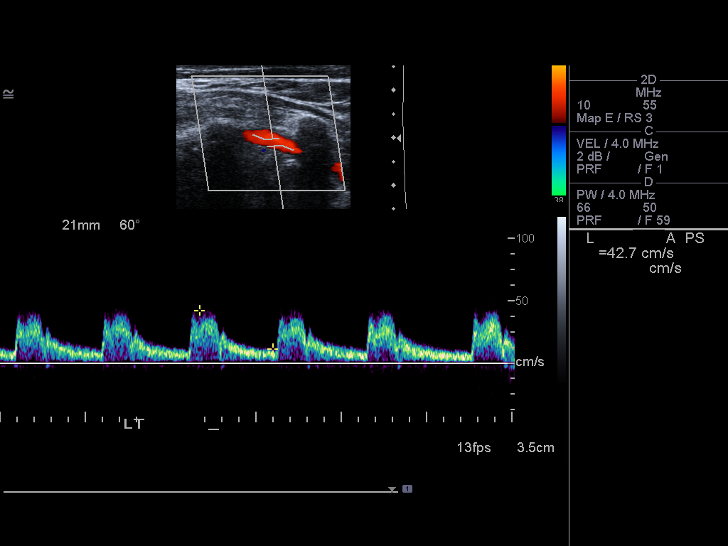

[13 of 24 positions shown; findings below may reference images not displayed]

FINDINGS: Criteria: Quantification of carotid stenosis is based on velocity
parameters that correlate the residual internal carotid diameter
with NASCET-based stenosis levels, using the diameter of the distal
internal carotid lumen as the denominator for stenosis measurement.

The following velocity measurements were obtained:

RIGHT

ICA:  106 cm/sec

CCA:  94 cm/sec

SYSTOLIC ICA/CCA RATIO:

DIASTOLIC ICA/CCA RATIO:

ECA:  101 cm/sec

LEFT

ICA:  127 cm/sec

CCA:  98 cm/sec

SYSTOLIC ICA/CCA RATIO:

DIASTOLIC ICA/CCA RATIO:

ECA:  95 cm/sec

RIGHT CAROTID ARTERY: Echogenic plaque in the proximal internal
carotid artery and carotid bulb. Right carotid arteries are patent
without significant stenosis. Incidentally, there are small
hypoechoic right thyroid nodules or cysts.

RIGHT VERTEBRAL ARTERY: Antegrade flow and normal waveform in the
right vertebral artery.

LEFT CAROTID ARTERY: Mild plaque at the left carotid bulb and
proximal internal carotid artery. No significant stenosis in the
left internal carotid artery.

LEFT VERTEBRAL ARTERY: Antegrade flow and normal waveform in the
left vertebral artery.
IMPRESSION: Atherosclerotic disease in the carotid arteries, right side greater
the left. Estimated degree of stenosis in the internal carotid
arteries is less than 50% bilaterally.

Patent vertebral arteries.

## 2015-03-17 MED ORDER — ONETOUCH DELICA LANCETS FINE MISC: Status: DC

## 2015-03-17 MED ORDER — GLUCOSE BLOOD VI STRP: ORAL_STRIP | Status: DC

## 2015-03-17 NOTE — Progress Notes (Signed)
Pre visit review using our clinic review tool, if applicable. No additional management support is needed unless otherwise documented below in the visit note. 

## 2015-03-17 NOTE — Progress Notes (Signed)
---Patient ID: Brenda Spence, female    DOB: Nov 16, 1926  Age: 81 y.o. MRN: [id number]    Subjective:  Subjective HPI Brenda Spence presents for f/u cm, htn, and cholesterol.  Brenda Spence is also f/u vita D  HYPERTENSION  Blood pressure range---good per pt   Chest pain- no      Dyspnea- no Lightheadedness- no   Edema- no Other side effects - no   Medication compliance: good Low salt diet- yes  DIABETES  Blood Sugar ranges--- 140-200s  Polyuria- no  New Visual problems- no Hypoglycemic symptoms- no Other side effects-no Medication compliance - good Last eye exam- 02/2015 Foot exam- today  HYPERLIPIDEMIA  Medication compliance- good RUQ pain- no  Muscle aches- no Other side effects-no   Review of Systems  Constitutional: Negative for diaphoresis, appetite change, fatigue and unexpected weight change.  Eyes: Negative for pain, redness and visual disturbance.  Respiratory: Negative for cough, chest tightness, shortness of breath and wheezing.   Cardiovascular: Negative for chest pain, palpitations and leg swelling.  Endocrine: Negative for cold intolerance, heat intolerance, polydipsia, polyphagia and polyuria.  Genitourinary: Negative for dysuria, frequency and difficulty urinating.  Neurological: Negative for dizziness, light-headedness, numbness and headaches.  Psychiatric/Behavioral: Negative for dysphoric mood and decreased concentration. The patient is not nervous/anxious.     History Past Medical History  Diagnosis Date  . Post-menopausal   . CAD (coronary artery disease) 2006    PCI of RCA in [place], [place]  . Broken ribs   . CHF (congestive heart failure)   . Diabetes mellitus   . Hyperlipidemia   . Hypertension   . Arthritis   . Glaucoma     Brenda Spence has past surgical history that includes Breast surgery; Appendectomy; Bladder surgery (1960); and stent implant.   Her family history includes Cancer in her sister; Coronary artery disease in her mother; Diabetes in her  sister; Stroke in her brother.Brenda Spence reports that Brenda Spence has never smoked. Brenda Spence has never used smokeless tobacco. Brenda Spence reports that Brenda Spence does not drink alcohol or use illicit drugs.  Current Outpatient Prescriptions on File Prior to Visit  Medication Sig Dispense Refill  . amLODipine (NORVASC) 10 MG tablet TAKE ONE DAILY 90 tablet 1  . aspirin 81 MG tablet Take 81 mg by mouth daily.    . benazepril (LOTENSIN) 20 MG tablet     . carvedilol (COREG) 12.5 MG tablet Take two tablets in am and two tables in the evening 270 tablet 1  . furosemide (LASIX) 40 MG tablet Take 1/2 tablet daily 45 tablet 1  . glyBURIDE-metformin (GLUCOVANCE) 5-500 MG per tablet Take 1 tablet by mouth 2 (two) times daily. 180 tablet 1  . latanoprost (XALATAN) 0.005 % ophthalmic solution Place 1 drop into both eyes at bedtime.     . Vitamin D, Ergocalciferol, (DRISDOL) 50000 UNITS CAPS capsule Take 1 capsule (50,000 Units total) by mouth every 7 (seven) days. 8 capsule 0   No current facility-administered medications on file prior to visit.     Objective:  Objective Physical Exam  Constitutional: Brenda Spence is oriented to person, place, and time. Brenda Spence appears well-developed and well-nourished.  HENT:  Head: Normocephalic and atraumatic.  Eyes: Conjunctivae and EOM are normal.  Neck: Normal range of motion. Neck supple. No JVD present. Carotid bruit is not present. No thyromegaly present.  Cardiovascular: Normal rate, regular rhythm and normal heart sounds.   No murmur heard. Pulmonary/Chest: Effort normal and breath sounds normal. No respiratory distress. Brenda Spence has no  wheezes. Brenda Spence has no rales. Brenda Spence exhibits no tenderness.  Musculoskeletal: Brenda Spence exhibits no edema.  Neurological: Brenda Spence is alert and oriented to person, place, and time.  Psychiatric: Brenda Spence has a normal mood and affect. Her behavior is normal. Judgment and thought content normal.  Sensory exam of the foot is normal, tested with the monofilament. Good pulses, no lesions or ulcers,  good peripheral pulses. BP 146/84 mmHg  Pulse 67  Temp(Src) 97.7 F (36.5 C) (Oral)  Resp 16  Ht 5' 3.5" (1.613 m)  Wt 138 lb (62.596 kg)  BMI 24.06 kg/m2  SpO2 98% Wt Readings from Last 3 Encounters:  03/17/15 138 lb (62.596 kg)  12/20/14 138 lb (62.596 kg)  09/20/14 137 lb (62.143 kg)     Lab Results  Component Value Date   WBC 5.2 12/20/2014   HGB 10.9* 12/20/2014   HCT 33.7* 12/20/2014   PLT 284 12/20/2014   GLUCOSE 196* 12/20/2014   CHOL 242* 12/20/2014   TRIG 153* 12/20/2014   HDL 42* 12/20/2014   LDLCALC 169* 12/20/2014   ALT 12 12/20/2014   AST 19 12/20/2014   NA 135 12/20/2014   K 4.7 12/20/2014   CL 100 12/20/2014   CREATININE 1.36* 12/20/2014   BUN 27* 12/20/2014   CO2 27 12/20/2014   TSH 1.070 12/20/2014   HGBA1C 6.7* 12/20/2014   MICROALBUR 12.6* 12/20/2014    Mm Digital Screening Bilateral  03/16/2015   CLINICAL DATA:  Screening.  EXAM: DIGITAL SCREENING BILATERAL MAMMOGRAM WITH CAD  COMPARISON:  Previous exam(s).  ACR Breast Density Category b: There are scattered areas of fibroglandular density.  FINDINGS: There are no findings suspicious for malignancy. Images were processed with CAD.  IMPRESSION: No mammographic evidence of malignancy. A result letter of this screening mammogram will be mailed directly to the patient.  RECOMMENDATION: Screening mammogram in one year. (Code:SM-B-01Y)  BI-RADS CATEGORY  1: Negative.   Electronically Signed   By: Annia Belt M.D.   On: 03/16/2015 09:22     Assessment & Plan:  Plan I am having Brenda Spence start on glucose blood and ONETOUCH DELICA LANCETS FINE. I am also having her maintain her aspirin, latanoprost, furosemide, glyBURIDE-metformin, benazepril, Vitamin D (Ergocalciferol), carvedilol, and amLODipine.  Meds ordered this encounter  Medications  . glucose blood (ONETOUCH VERIO) test strip    Sig: Check Blood sugar twice a day. Dx: E11.9    Dispense:  100 each    Refill:  prn  . ONETOUCH DELICA LANCETS FINE  MISC    Sig: Check blood sugar twice daily. Dx: E11.9    Dispense:  100 each    Refill:  prn    Problem List Items Addressed This Visit    Essential hypertension, benign    con't lisinopril  con't coreg and norvasc rto 6 months       Diabetes mellitus type II, controlled    con't metformin/ glyburide Check labs       Other Visit Diagnoses    Diabetes mellitus type II, uncontrolled    -  Primary    Relevant Orders    Basic metabolic panel    CBC with Differential/Platelet    Hemoglobin A1c    Hepatic function panel    Lipid panel    Microalbumin / creatinine urine ratio    POCT urinalysis dipstick (Completed)    Essential hypertension        Relevant Orders    Basic metabolic panel    CBC with Differential/Platelet  Hyperlipidemia LDL goal <70        Relevant Orders    Hemoglobin A1c    Hepatic function panel    Lipid panel    Abnormal urine        Relevant Orders    Urine Culture       Follow-up: Return in about 6 months (around 09/16/2015), or if symptoms worsen or fail to improve.  Loreen FreudYvonne Lowne, DO

## 2015-03-17 NOTE — Assessment & Plan Note (Signed)
con't lisinopril  con't coreg and norvasc rto 6 months

## 2015-03-17 NOTE — Patient Instructions (Signed)
Vitamin D Deficiency Vitamin D is an important vitamin that your body needs. Having too little of it in your body is called a deficiency. A very bad deficiency can make your bones soft and can cause a condition called rickets.  Vitamin D is important to your body for different reasons, such as:   It helps your body absorb 2 minerals called calcium and phosphorus.  It helps make your bones healthy.  It may prevent some diseases, such as diabetes and multiple sclerosis.  It helps your muscles and heart. You can get vitamin D in several ways. It is a natural part of some foods. The vitamin is also added to some dairy products and cereals. Some people take vitamin D supplements. Also, your body makes vitamin D when you are in the sun. It changes the sun's rays into a form of the vitamin that your body can use. CAUSES   Not eating enough foods that contain vitamin D.  Not getting enough sunlight.  Having certain digestive system diseases that make it hard to absorb vitamin D. These diseases include Crohn's disease, chronic pancreatitis, and cystic fibrosis.  Having a surgery in which part of the stomach or small intestine is removed.  Being obese. Fat cells pull vitamin D out of your blood. That means that obese people may not have enough vitamin D left in their blood and in other body tissues.  Having chronic kidney or liver disease. RISK FACTORS Risk factors are things that make you more likely to develop a vitamin D deficiency. They include:  Being older.  Not being able to get outside very much.  Living in a nursing home.  Having had broken bones.  Having weak or thin bones (osteoporosis).  Having a disease or condition that changes how your body absorbs vitamin D.  Having dark skin.  Some medicines such as seizure medicines or steroids.  Being overweight or obese. SYMPTOMS Mild cases of vitamin D deficiency may not have any symptoms. If you have a very bad case, symptoms  may include:  Bone pain.  Muscle pain.  Falling often.  Broken bones caused by a minor injury, due to osteoporosis. DIAGNOSIS A blood test is the best way to tell if you have a vitamin D deficiency. TREATMENT Vitamin D deficiency can be treated in different ways. Treatment for vitamin D deficiency depends on what is causing it. Options include:  Taking vitamin D supplements.  Taking a calcium supplement. Your caregiver will suggest what dose is best for you. HOME CARE INSTRUCTIONS  Take any supplements that your caregiver prescribes. Follow the directions carefully. Take only the suggested amount.  Have your blood tested 2 months after you start taking supplements.  Eat foods that contain vitamin D. Healthy choices include:  Fortified dairy products, cereals, or juices. Fortified means vitamin D has been added to the food. Check the label on the package to be sure.  Fatty fish like salmon or trout.  Eggs.  Oysters.  Do not use a tanning bed.  Keep your weight at a healthy level. Lose weight if you need to.  Keep all follow-up appointments. Your caregiver will need to perform blood tests to make sure your vitamin D deficiency is going away. SEEK MEDICAL CARE IF:  You have any questions about your treatment.  You continue to have symptoms of vitamin D deficiency.  You have nausea or vomiting.  You are constipated.  You feel confused.  You have severe abdominal or back pain. MAKE   SURE YOU:  Understand these instructions.  Will watch your condition.  Will get help right away if you are not doing well or get worse. Document Released: 12/24/2011 Document Revised: 01/26/2013 Document Reviewed: 12/24/2011 Exit[employer] Patient Information 2015 [employer]. This information is not intended to replace advice given to you by your health care provider. Make sure you discuss any questions you have with your health care provider.  

## 2015-03-17 NOTE — Assessment & Plan Note (Signed)
con't metformin/ glyburide Check labs

## 2015-03-18 LAB — BASIC METABOLIC PANEL
BUN: 22 mg/dL (ref 6–23)
CALCIUM: 9.5 mg/dL (ref 8.4–10.5)
CO2: 29 mEq/L (ref 19–32)
CREATININE: 1.22 mg/dL — AB (ref 0.40–1.20)
Chloride: 100 mEq/L (ref 96–112)
GFR: 44.46 mL/min — AB (ref >60.00)
GLUCOSE: 149 mg/dL — AB (ref 70–99)
Potassium: 4.2 mEq/L (ref 3.5–5.1)
Sodium: 135 mEq/L (ref 135–145)

## 2015-03-18 LAB — CBC WITH DIFFERENTIAL/PLATELET
BASOS ABS: 0 10*3/uL (ref 0.0–0.1)
Basophils Relative: 0.4 % (ref 0.0–3.0)
Eosinophils Absolute: 0.6 10*3/uL (ref 0.0–0.7)
Eosinophils Relative: 8.7 % — ABNORMAL HIGH (ref 0.0–5.0)
HCT: 34.6 % — ABNORMAL LOW (ref 36.0–46.0)
Hemoglobin: 11.4 g/dL — ABNORMAL LOW (ref 12.0–15.0)
Lymphocytes Relative: 27.6 % (ref 12.0–46.0)
Lymphs Abs: 1.8 10*3/uL (ref 0.7–4.0)
MCHC: 33 g/dL (ref 30.0–36.0)
MCV: 93.5 fl (ref 78.0–100.0)
MONOS PCT: 5.9 % (ref 3.0–12.0)
Monocytes Absolute: 0.4 10*3/uL (ref 0.1–1.0)
NEUTROS ABS: 3.8 10*3/uL (ref 1.4–7.7)
Neutrophils Relative %: 57.4 % (ref 43.0–77.0)
Platelets: 269 10*3/uL (ref 150.0–400.0)
RBC: 3.7 Mil/uL — ABNORMAL LOW (ref 3.87–5.11)
RDW: 12.3 % (ref 11.5–15.5)
WBC: 6.6 10*3/uL (ref 4.0–10.5)

## 2015-03-18 LAB — LIPID PANEL
CHOL/HDL RATIO: 6
Cholesterol: 265 mg/dL — ABNORMAL HIGH (ref 0–200)
HDL: 42.9 mg/dL (ref >39.00)
NonHDL: 222.1
Triglycerides: 310 mg/dL — ABNORMAL HIGH (ref 0.0–149.0)
VLDL: 62 mg/dL — AB (ref 0.0–40.0)

## 2015-03-18 LAB — MICROALBUMIN / CREATININE URINE RATIO
Creatinine,U: 16.4 mg/dL
MICROALB UR: 14.4 mg/dL — AB (ref 0.0–1.9)
MICROALB/CREAT RATIO: 87.8 mg/g — AB (ref 0.0–30.0)

## 2015-03-18 LAB — URINE CULTURE
Colony Count: NO GROWTH
Organism ID, Bacteria: NO GROWTH

## 2015-03-18 LAB — HEPATIC FUNCTION PANEL
ALBUMIN: 4.1 g/dL (ref 3.5–5.2)
ALT: 13 U/L (ref 0–35)
AST: 18 U/L (ref 0–37)
Alkaline Phosphatase: 70 U/L (ref 39–117)
BILIRUBIN DIRECT: 0.1 mg/dL (ref 0.0–0.3)
BILIRUBIN TOTAL: 0.4 mg/dL (ref 0.2–1.2)
Total Protein: 7.6 g/dL (ref 6.0–8.3)

## 2015-03-18 LAB — LDL CHOLESTEROL, DIRECT: Direct LDL: 162 mg/dL

## 2015-03-18 LAB — HEMOGLOBIN A1C: HEMOGLOBIN A1C: 6.6 % — AB (ref 4.6–6.5)

## 2015-03-23 DIAGNOSIS — H4011X2 Primary open-angle glaucoma, moderate stage: Secondary | ICD-10-CM | POA: Diagnosis not present

## 2015-03-23 DIAGNOSIS — Z961 Presence of intraocular lens: Secondary | ICD-10-CM | POA: Diagnosis not present

## 2015-03-23 DIAGNOSIS — E119 Type 2 diabetes mellitus without complications: Secondary | ICD-10-CM | POA: Diagnosis not present

## 2015-03-28 ENCOUNTER — Telehealth: Payer: Self-pay | Admitting: Family Medicine

## 2015-03-28 MED ORDER — GLYBURIDE-METFORMIN 5-500 MG PO TABS: 1.0000 | ORAL_TABLET | Freq: Two times a day (BID) | ORAL | Status: DC

## 2015-03-28 NOTE — Telephone Encounter (Signed)
Caller name: Avanthika Irvan Relationship to patient: self Can be reached: [phone] Pharmacy: [id number] - [place], [place] - [address]  Reason for call: Pt needing refill on metformin. Pt has 1 week on hand.

## 2015-03-28 NOTE — Telephone Encounter (Signed)
Rx faxed.    KP 

## 2015-04-30 ENCOUNTER — Encounter (HOSPITAL_BASED_OUTPATIENT_CLINIC_OR_DEPARTMENT_OTHER): Payer: Self-pay | Admitting: Emergency Medicine

## 2015-04-30 ENCOUNTER — Emergency Department (HOSPITAL_BASED_OUTPATIENT_CLINIC_OR_DEPARTMENT_OTHER): Payer: No Typology Code available for payment source

## 2015-04-30 ENCOUNTER — Emergency Department (HOSPITAL_BASED_OUTPATIENT_CLINIC_OR_DEPARTMENT_OTHER)
Admission: EM | Admit: 2015-04-30 | Discharge: 2015-04-30 | Disposition: A | Discharge status: Home / Self Care | Payer: No Typology Code available for payment source | Attending: Emergency Medicine | Admitting: Emergency Medicine

## 2015-04-30 DIAGNOSIS — S8001XA Contusion of right knee, initial encounter: Secondary | ICD-10-CM | POA: Diagnosis not present

## 2015-04-30 DIAGNOSIS — H409 Unspecified glaucoma: Secondary | ICD-10-CM | POA: Diagnosis not present

## 2015-04-30 DIAGNOSIS — I10 Essential (primary) hypertension: Secondary | ICD-10-CM | POA: Insufficient documentation

## 2015-04-30 DIAGNOSIS — M25561 Pain in right knee: Secondary | ICD-10-CM | POA: Diagnosis not present

## 2015-04-30 DIAGNOSIS — Y92410 Unspecified street and highway as the place of occurrence of the external cause: Secondary | ICD-10-CM | POA: Insufficient documentation

## 2015-04-30 DIAGNOSIS — E780 Pure hypercholesterolemia: Secondary | ICD-10-CM | POA: Insufficient documentation

## 2015-04-30 DIAGNOSIS — E785 Hyperlipidemia, unspecified: Secondary | ICD-10-CM | POA: Insufficient documentation

## 2015-04-30 DIAGNOSIS — Z7982 Long term (current) use of aspirin: Secondary | ICD-10-CM | POA: Insufficient documentation

## 2015-04-30 DIAGNOSIS — E119 Type 2 diabetes mellitus without complications: Secondary | ICD-10-CM | POA: Insufficient documentation

## 2015-04-30 DIAGNOSIS — Y998 Other external cause status: Secondary | ICD-10-CM | POA: Diagnosis not present

## 2015-04-30 DIAGNOSIS — I509 Heart failure, unspecified: Secondary | ICD-10-CM | POA: Insufficient documentation

## 2015-04-30 DIAGNOSIS — Y9389 Activity, other specified: Secondary | ICD-10-CM | POA: Diagnosis not present

## 2015-04-30 DIAGNOSIS — S4991XA Unspecified injury of right shoulder and upper arm, initial encounter: Secondary | ICD-10-CM | POA: Diagnosis not present

## 2015-04-30 DIAGNOSIS — Z79899 Other long term (current) drug therapy: Secondary | ICD-10-CM | POA: Insufficient documentation

## 2015-04-30 DIAGNOSIS — M1990 Unspecified osteoarthritis, unspecified site: Secondary | ICD-10-CM | POA: Diagnosis not present

## 2015-04-30 DIAGNOSIS — Z8781 Personal history of (healed) traumatic fracture: Secondary | ICD-10-CM | POA: Diagnosis not present

## 2015-04-30 DIAGNOSIS — S8991XA Unspecified injury of right lower leg, initial encounter: Secondary | ICD-10-CM | POA: Diagnosis present

## 2015-04-30 DIAGNOSIS — R52 Pain, unspecified: Secondary | ICD-10-CM

## 2015-04-30 DIAGNOSIS — I2510 Atherosclerotic heart disease of native coronary artery without angina pectoris: Secondary | ICD-10-CM | POA: Insufficient documentation

## 2015-04-30 MED ORDER — HYDROCODONE-ACETAMINOPHEN 5-325 MG PO TABS: ORAL_TABLET | ORAL | Status: DC
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
## 2015-04-30 NOTE — Discharge Instructions (Signed)
Take vicodin for breakthrough pain, do not drink alcohol, drive, care for children or do other critical tasks while taking vicodin. ° °Please follow with your primary care doctor in the next 2 days for a check-up. They must obtain records for further management.  ° °Do not hesitate to return to the Emergency Department for any new, worsening or concerning symptoms.  ° °
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
## 2015-05-04 ENCOUNTER — Ambulatory Visit (INDEPENDENT_AMBULATORY_CARE_PROVIDER_SITE_OTHER): Payer: Medicare Other | Admitting: Medical

## 2015-05-04 ENCOUNTER — Telehealth: Payer: Self-pay | Admitting: Medical

## 2015-05-04 ENCOUNTER — Encounter: Payer: Self-pay | Admitting: Medical

## 2015-05-04 VITALS — BP 155/70 | HR 75 | Temp 98.6°F | Ht 63.5 in | Wt 136.4 lb

## 2015-05-04 DIAGNOSIS — R7989 Other specified abnormal findings of blood chemistry: Secondary | ICD-10-CM | POA: Diagnosis not present

## 2015-05-04 DIAGNOSIS — I10 Essential (primary) hypertension: Secondary | ICD-10-CM

## 2015-05-04 DIAGNOSIS — E559 Vitamin D deficiency, unspecified: Secondary | ICD-10-CM

## 2015-05-04 DIAGNOSIS — S8001XA Contusion of right knee, initial encounter: Secondary | ICD-10-CM

## 2015-05-04 LAB — VITAMIN D 25 HYDROXY (VIT D DEFICIENCY, FRACTURES): VITD: 25.25 ng/mL — AB (ref 30.00–100.00)

## 2015-05-04 MED ORDER — VITAMIN D (ERGOCALCIFEROL) 1.25 MG (50000 UNIT) PO CAPS: 50000.0000 [IU] | ORAL_CAPSULE | ORAL | Status: DC
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
## 2015-05-04 NOTE — Progress Notes (Signed)
Pre visit review using our clinic review tool, if applicable. No additional management support is needed unless otherwise documented below in the visit note. 

## 2015-05-04 NOTE — Patient Instructions (Addendum)
Knee contusion- neg xray and exam neg. Healing well. Only small healing abrasion.  Low vit d- will get level today and advise on further tx. Was on 8 wks of vit d.  htn- Pt bp when I checked better  And her home reading were 147/70 today and 147/66 yesterday. Want daily bp check and log readings. Follow up in 2 wks bp check  Also please bring in your blood pressure cuff on day that you return.

## 2015-05-04 NOTE — Telephone Encounter (Signed)
Rx vitamin D 50,000 units to take one tab a week for 3 wks.

## 2015-05-05 NOTE — Telephone Encounter (Signed)
Called patient regarding lab results. Advised Rx for Vitamin D has been sent to pharmacy and that she is to take for 3 weeks and return at appointment date given for follow up. Patient agreed.

## 2015-05-18 ENCOUNTER — Encounter: Payer: Self-pay | Admitting: Medical

## 2015-05-18 ENCOUNTER — Ambulatory Visit: Payer: Medicare Other | Admitting: Medical

## 2015-05-18 ENCOUNTER — Ambulatory Visit (INDEPENDENT_AMBULATORY_CARE_PROVIDER_SITE_OTHER): Payer: Medicare Other | Admitting: Medical

## 2015-05-18 VITALS — BP 170/65 | HR 72 | Temp 98.7°F | Ht 63.5 in | Wt 136.6 lb

## 2015-05-18 DIAGNOSIS — I10 Essential (primary) hypertension: Secondary | ICD-10-CM

## 2015-05-18 DIAGNOSIS — E559 Vitamin D deficiency, unspecified: Secondary | ICD-10-CM

## 2015-05-18 NOTE — Assessment & Plan Note (Signed)
Repeat level in 2 wks.
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
## 2015-05-18 NOTE — Patient Instructions (Addendum)
Essential hypertension, benign Decided to have her increase coreg to 25 mg bid. She was taking 25 mg in am and only 12.5 mg at night. Her bp at home are borderline. Her machine appears accurate today. My readings was very similar. She will call us in 2 wks with bp daily readings. Then will decide on follow up date.  Vitamin D deficiency Repeat level in 2 wks.    Knee is better no tx needed.  Follow up date to be determined depending on at home bp readings up date in 2 wks.

## 2015-05-18 NOTE — Progress Notes (Signed)
Pre visit review using our clinic review tool, if applicable. No additional management support is needed unless otherwise documented below in the visit note. 

## 2015-05-18 NOTE — Assessment & Plan Note (Signed)
Decided to have her increase coreg to 25 mg bid. She was taking 25 mg in am and only 12.5 mg at night. Her bp at home are borderline. Her machine appears accurate today. My readings was very similar. She will call us in 2 wks with bp daily readings. Then will decide on follow up date.

## 2015-05-23 ENCOUNTER — Other Ambulatory Visit: Payer: Self-pay | Admitting: Medical

## 2015-05-23 ENCOUNTER — Telehealth: Payer: Self-pay | Admitting: Family Medicine

## 2015-05-23 NOTE — Telephone Encounter (Signed)
Spoke to patient regarding the need to come in for Vitamin D level before medication can be filled

## 2015-05-23 NOTE — Telephone Encounter (Signed)
Relation to BJ:YNWG Call back number:[phone] Pharmacy:  [id number] [place], [place] - [address] [phone] (Phone) [phone] (Fax)         Reason for call:  Patient unsure if she needs a refill Vitamin D, Ergocalciferol, (DRISDOL) 50000 UNITS CAPS capsule

## 2015-05-23 NOTE — Telephone Encounter (Addendum)
Called patient placed on Lab schedule for 06/01/15. For Vitamin D level. Patient vitamin D refilled refused pending lab result.

## 2015-06-01 ENCOUNTER — Other Ambulatory Visit (INDEPENDENT_AMBULATORY_CARE_PROVIDER_SITE_OTHER): Payer: Medicare Other

## 2015-06-01 DIAGNOSIS — E559 Vitamin D deficiency, unspecified: Secondary | ICD-10-CM

## 2015-06-01 LAB — VITAMIN D 25 HYDROXY (VIT D DEFICIENCY, FRACTURES): VITD: 44.77 ng/mL (ref 30.00–100.00)

## 2015-06-07 ENCOUNTER — Other Ambulatory Visit: Payer: Self-pay

## 2015-06-07 MED ORDER — FUROSEMIDE 40 MG PO TABS: ORAL_TABLET | ORAL | Status: DC

## 2015-06-07 MED ORDER — BENAZEPRIL HCL 20 MG PO TABS: 20.0000 mg | ORAL_TABLET | Freq: Every day | ORAL | Status: DC

## 2015-06-14 ENCOUNTER — Telehealth: Payer: Self-pay | Admitting: Family Medicine

## 2015-06-14 MED ORDER — CARVEDILOL 12.5 MG PO TABS: ORAL_TABLET | ORAL | Status: DC

## 2015-06-14 NOTE — Telephone Encounter (Signed)
Rx faxed.    KP 

## 2015-06-14 NOTE — Telephone Encounter (Signed)
Caller name: Kealani Leckey  Relationship to patient: Self  Can be reached: [phone] Pharmacy:  Reason for call: pt need a refill on her COREG Rx. She would like a 3 mth supply

## 2015-09-01 ENCOUNTER — Telehealth: Payer: Self-pay | Admitting: Family Medicine

## 2015-09-01 MED ORDER — AMLODIPINE BESYLATE 10 MG PO TABS: ORAL_TABLET | ORAL | Status: DC

## 2015-09-01 NOTE — Telephone Encounter (Signed)
Relation to GN:FAOZpt:self Call back number:[phone] Pharmacy: [id number] [place] - [address] [phone] (Phone) [phone] (Fax)         Reason for call:  Patient states she has called several times as well as her pharmacy has called regarding refilling amLODipine (NORVASC) 10 MG tablet, advised patient there is no documentation and I do apologize for the incovenacne. Patient stated she is completely out.

## 2015-09-01 NOTE — Telephone Encounter (Signed)
Rx faxed.    KP 

## 2015-09-22 DIAGNOSIS — E119 Type 2 diabetes mellitus without complications: Secondary | ICD-10-CM | POA: Diagnosis not present

## 2015-09-22 DIAGNOSIS — H16223 Keratoconjunctivitis sicca, not specified as Sjogren's, bilateral: Secondary | ICD-10-CM | POA: Diagnosis not present

## 2015-09-22 DIAGNOSIS — H401121 Primary open-angle glaucoma, left eye, mild stage: Secondary | ICD-10-CM | POA: Diagnosis not present

## 2015-09-22 DIAGNOSIS — H401112 Primary open-angle glaucoma, right eye, moderate stage: Secondary | ICD-10-CM | POA: Diagnosis not present

## 2015-09-22 LAB — HM DIABETES EYE EXAM

## 2015-09-29 ENCOUNTER — Other Ambulatory Visit: Payer: Self-pay | Admitting: Family Medicine

## 2015-09-29 ENCOUNTER — Encounter: Payer: Self-pay | Admitting: Family Medicine

## 2015-09-29 ENCOUNTER — Ambulatory Visit (INDEPENDENT_AMBULATORY_CARE_PROVIDER_SITE_OTHER): Payer: Medicare Other | Admitting: Family Medicine

## 2015-09-29 VITALS — BP 120/68 | HR 65 | Temp 97.6°F | Ht 64.0 in | Wt 140.0 lb

## 2015-09-29 DIAGNOSIS — Z23 Encounter for immunization: Secondary | ICD-10-CM | POA: Diagnosis not present

## 2015-09-29 DIAGNOSIS — E785 Hyperlipidemia, unspecified: Secondary | ICD-10-CM | POA: Diagnosis not present

## 2015-09-29 DIAGNOSIS — E2839 Other primary ovarian failure: Secondary | ICD-10-CM | POA: Diagnosis not present

## 2015-09-29 DIAGNOSIS — E11618 Type 2 diabetes mellitus with other diabetic arthropathy: Secondary | ICD-10-CM

## 2015-09-29 DIAGNOSIS — I10 Essential (primary) hypertension: Secondary | ICD-10-CM | POA: Diagnosis not present

## 2015-09-29 LAB — COMPREHENSIVE METABOLIC PANEL
ALT: 12 U/L (ref 0–35)
AST: 18 U/L (ref 0–37)
Albumin: 4.1 g/dL (ref 3.5–5.2)
Alkaline Phosphatase: 70 U/L (ref 39–117)
BUN: 27 mg/dL — ABNORMAL HIGH (ref 6–23)
CALCIUM: 9.5 mg/dL (ref 8.4–10.5)
CO2: 31 mEq/L (ref 19–32)
CREATININE: 1.22 mg/dL — AB (ref 0.40–1.20)
Chloride: 101 mEq/L (ref 96–112)
GFR: 44.4 mL/min — ABNORMAL LOW (ref >60.00)
Glucose, Bld: 172 mg/dL — ABNORMAL HIGH (ref 70–99)
Potassium: 4 mEq/L (ref 3.5–5.1)
Sodium: 138 mEq/L (ref 135–145)
Total Bilirubin: 0.5 mg/dL (ref 0.2–1.2)
Total Protein: 7.5 g/dL (ref 6.0–8.3)

## 2015-09-29 LAB — LIPID PANEL
CHOL/HDL RATIO: 6
Cholesterol: 298 mg/dL — ABNORMAL HIGH (ref 0–200)
HDL: 46.1 mg/dL (ref >39.00)
NONHDL: 252.19
Triglycerides: 249 mg/dL — ABNORMAL HIGH (ref 0.0–149.0)
VLDL: 49.8 mg/dL — AB (ref 0.0–40.0)

## 2015-09-29 LAB — HEMOGLOBIN A1C: Hgb A1c MFr Bld: 7 % — ABNORMAL HIGH (ref 4.6–6.5)

## 2015-09-29 LAB — LDL CHOLESTEROL, DIRECT: Direct LDL: 194 mg/dL

## 2015-09-29 NOTE — Patient Instructions (Addendum)
Please Schedule your Bone Density Scan at: [facility] Phone: [phone] Location: [address] Hours: Monday - Friday, 7:30 a.m. - 5:30 p.m, Open MRI Services, Saturday - 8 a.m. - 5 p.m. Basic Carbohydrate Counting for Diabetes Mellitus Carbohydrate counting is a method for keeping track of the amount of carbohydrates you eat. Eating carbohydrates naturally increases the level of sugar (glucose) in your blood, so it is important for you to know the amount that is okay for you to have in every meal. Carbohydrate counting helps keep the level of glucose in your blood within normal limits. The amount of carbohydrates allowed is different for every person. A dietitian can help you calculate the amount that is right for you. Once you know the amount of carbohydrates you can have, you can count the carbohydrates in the foods you want to eat. Carbohydrates are found in the following foods:  Grains, such as breads and cereals.  Dried beans and soy products.  Starchy vegetables, such as potatoes, peas, and corn.  Fruit and fruit juices.  Milk and yogurt.  Sweets and snack foods, such as cake, cookies, candy, chips, soft drinks, and fruit drinks. CARBOHYDRATE COUNTING There are two ways to count the carbohydrates in your food. You can use either of the methods or a combination of both. Reading the "Nutrition Facts" on Packaged Food The "Nutrition Facts" is an area that is included on the labels of almost all packaged food and beverages in the [place]. It includes the serving size of that food or beverage and information about the nutrients in each serving of the food, including the grams (g) of carbohydrate per serving.  Decide the number of servings of this food or beverage that you will be able to eat or drink. Multiply that number of servings by the number of grams of carbohydrate that is listed on the label for that serving. The total will be the amount of carbohydrates you  will be having when you eat or drink this food or beverage. Learning Standard Serving Sizes of Food When you eat food that is not packaged or does not include "Nutrition Facts" on the label, you need to measure the servings in order to count the amount of carbohydrates.A serving of most carbohydrate-rich foods contains about 15 g of carbohydrates. The following list includes serving sizes of carbohydrate-rich foods that provide 15 g ofcarbohydrate per serving:   1 slice of bread (1 oz) or 1 six-inch tortilla.    of a hamburger bun or English muffin.  4-6 crackers.   cup unsweetened dry cereal.    cup hot cereal.   cup rice or pasta.    cup mashed potatoes or  of a large baked potato.  1 cup fresh fruit or one small piece of fruit.    cup canned or frozen fruit or fruit juice.  1 cup milk.   cup plain fat-free yogurt or yogurt sweetened with artificial sweeteners.   cup cooked dried beans or starchy vegetable, such as peas, corn, or potatoes.  Decide the number of standard-size servings that you will eat. Multiply that number of servings by 15 (the grams of carbohydrates in that serving). For example, if you eat 2 cups of strawberries, you will have eaten 2 servings and 30 g of carbohydrates (2 servings x 15 g = 30 g). For foods such as soups and casseroles, in which more than one food is mixed in, you will need to count the carbohydrates in each food  that is included. EXAMPLE OF CARBOHYDRATE COUNTING Sample Dinner  3 oz chicken breast.   cup of brown rice.   cup of corn.  1 cup milk.   1 cup strawberries with sugar-free whipped topping.  Carbohydrate Calculation Step 1: Identify the foods that contain carbohydrates:   Rice.   Corn.   Milk.   Strawberries. Step 2:Calculate the number of servings eaten of each:   2 servings of rice.   1 serving of corn.   1 serving of milk.   1 serving of strawberries. Step 3: Multiply each of those  number of servings by 15 g:   2 servings of rice x 15 g = 30 g.   1 serving of corn x 15 g = 15 g.   1 serving of milk x 15 g = 15 g.   1 serving of strawberries x 15 g = 15 g. Step 4: Add together all of the amounts to find the total grams of carbohydrates eaten: 30 g + 15 g + 15 g + 15 g = 75 g.   This information is not intended to replace advice given to you by your health care provider. Make sure you discuss any questions you have with your health care provider.   Document Released: 10/01/2005 Document Revised: 10/22/2014 Document Reviewed: 08/28/2013 Elsevier Interactive Patient Education [employer]2016 Elsevier Inc.
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
## 2015-09-29 NOTE — Progress Notes (Signed)
Pre visit review using our clinic review tool, if applicable. No additional management support is needed unless otherwise documented below in the visit note. 

## 2015-10-12 ENCOUNTER — Encounter: Payer: Self-pay | Admitting: *Deleted

## 2015-10-13 ENCOUNTER — Other Ambulatory Visit: Payer: Self-pay

## 2015-10-13 MED ORDER — GLYBURIDE-METFORMIN 5-500 MG PO TABS: 2.0000 | ORAL_TABLET | Freq: Two times a day (BID) | ORAL | Status: DC

## 2015-10-16 ENCOUNTER — Other Ambulatory Visit: Payer: Self-pay | Admitting: Family Medicine

## 2015-10-19 ENCOUNTER — Observation Stay (HOSPITAL_COMMUNITY): Payer: Medicare Other

## 2015-10-19 ENCOUNTER — Encounter (HOSPITAL_COMMUNITY): Payer: Self-pay | Admitting: *Deleted

## 2015-10-19 ENCOUNTER — Emergency Department (HOSPITAL_COMMUNITY): Payer: Medicare Other

## 2015-10-19 ENCOUNTER — Observation Stay (HOSPITAL_COMMUNITY)
Admission: EM | Admit: 2015-10-19 | Discharge: 2015-10-21 | Disposition: A | Discharge status: Home / Self Care | Payer: Medicare Other | Attending: Internal Medicine | Admitting: Internal Medicine

## 2015-10-19 DIAGNOSIS — I129 Hypertensive chronic kidney disease with stage 1 through stage 4 chronic kidney disease, or unspecified chronic kidney disease: Secondary | ICD-10-CM | POA: Diagnosis not present

## 2015-10-19 DIAGNOSIS — R079 Chest pain, unspecified: Secondary | ICD-10-CM | POA: Diagnosis not present

## 2015-10-19 DIAGNOSIS — K222 Esophageal obstruction: Secondary | ICD-10-CM | POA: Diagnosis not present

## 2015-10-19 DIAGNOSIS — Z7982 Long term (current) use of aspirin: Secondary | ICD-10-CM | POA: Diagnosis not present

## 2015-10-19 DIAGNOSIS — M19011 Primary osteoarthritis, right shoulder: Secondary | ICD-10-CM | POA: Insufficient documentation

## 2015-10-19 DIAGNOSIS — E785 Hyperlipidemia, unspecified: Secondary | ICD-10-CM | POA: Diagnosis not present

## 2015-10-19 DIAGNOSIS — N182 Chronic kidney disease, stage 2 (mild): Secondary | ICD-10-CM | POA: Diagnosis not present

## 2015-10-19 DIAGNOSIS — I2510 Atherosclerotic heart disease of native coronary artery without angina pectoris: Secondary | ICD-10-CM | POA: Insufficient documentation

## 2015-10-19 DIAGNOSIS — I252 Old myocardial infarction: Secondary | ICD-10-CM

## 2015-10-19 DIAGNOSIS — R1310 Dysphagia, unspecified: Secondary | ICD-10-CM | POA: Diagnosis not present

## 2015-10-19 DIAGNOSIS — I5032 Chronic diastolic (congestive) heart failure: Secondary | ICD-10-CM | POA: Diagnosis not present

## 2015-10-19 DIAGNOSIS — Z955 Presence of coronary angioplasty implant and graft: Secondary | ICD-10-CM | POA: Insufficient documentation

## 2015-10-19 DIAGNOSIS — I10 Essential (primary) hypertension: Secondary | ICD-10-CM | POA: Diagnosis present

## 2015-10-19 DIAGNOSIS — E1122 Type 2 diabetes mellitus with diabetic chronic kidney disease: Secondary | ICD-10-CM | POA: Diagnosis not present

## 2015-10-19 DIAGNOSIS — Z794 Long term (current) use of insulin: Secondary | ICD-10-CM | POA: Insufficient documentation

## 2015-10-19 DIAGNOSIS — R072 Precordial pain: Secondary | ICD-10-CM | POA: Diagnosis not present

## 2015-10-19 HISTORY — DX: Fatty (change of) liver, not elsewhere classified: K76.0

## 2015-10-19 HISTORY — DX: Dysphagia, unspecified: R13.10

## 2015-10-19 LAB — CBC
HEMATOCRIT: 33.2 % — AB (ref 36.0–46.0)
HEMOGLOBIN: 11 g/dL — AB (ref 12.0–15.0)
MCH: 31.1 pg (ref 26.0–34.0)
MCHC: 33.1 g/dL (ref 30.0–36.0)
MCV: 93.8 fL (ref 78.0–100.0)
Platelets: 242 10*3/uL (ref 150–400)
RBC: 3.54 MIL/uL — ABNORMAL LOW (ref 3.87–5.11)
RDW: 11.8 % (ref 11.5–15.5)
WBC: 5.3 10*3/uL (ref 4.0–10.5)

## 2015-10-19 LAB — GLUCOSE, CAPILLARY
GLUCOSE-CAPILLARY: 116 mg/dL — AB (ref 65–99)
GLUCOSE-CAPILLARY: 211 mg/dL — AB (ref 65–99)

## 2015-10-19 LAB — TROPONIN I
Troponin I: <0.03 ng/mL (ref <0.031)
Troponin I: <0.03 ng/mL (ref <0.031)

## 2015-10-19 LAB — I-STAT TROPONIN, ED: Troponin i, poc: 0.01 ng/mL (ref 0.00–0.08)

## 2015-10-19 LAB — BASIC METABOLIC PANEL
ANION GAP: 11 (ref 5–15)
BUN: 20 mg/dL (ref 6–20)
CHLORIDE: 108 mmol/L (ref 101–111)
CO2: 23 mmol/L (ref 22–32)
Calcium: 9.6 mg/dL (ref 8.9–10.3)
Creatinine, Ser: 1.26 mg/dL — ABNORMAL HIGH (ref 0.44–1.00)
GFR calc Af Amer: 43 mL/min — ABNORMAL LOW (ref >60)
GFR, EST NON AFRICAN AMERICAN: 37 mL/min — AB (ref >60)
GLUCOSE: 173 mg/dL — AB (ref 65–99)
POTASSIUM: 4.3 mmol/L (ref 3.5–5.1)
Sodium: 142 mmol/L (ref 135–145)

## 2015-10-19 MED ORDER — ASPIRIN 81 MG PO CHEW
324.0000 mg | CHEWABLE_TABLET | Freq: Once | ORAL | Status: AC
  Administered 2015-10-19: 324 mg via ORAL
  Filled 2015-10-19: qty 4

## 2015-10-19 MED ORDER — LATANOPROST 0.005 % OP SOLN
1.0000 [drp] | Freq: Every day | OPHTHALMIC | Status: DC
  Administered 2015-10-19 – 2015-10-20 (×2): 1 [drp] via OPHTHALMIC
  Filled 2015-10-19: qty 2.5

## 2015-10-19 MED ORDER — ACETAMINOPHEN 650 MG RE SUPP: 650.0000 mg | Freq: Four times a day (QID) | RECTAL | Status: DC | PRN

## 2015-10-19 MED ORDER — HEPARIN SODIUM (PORCINE) 5000 UNIT/ML IJ SOLN
5000.0000 [IU] | Freq: Three times a day (TID) | INTRAMUSCULAR | Status: DC
  Administered 2015-10-19 – 2015-10-20 (×3): 5000 [IU] via SUBCUTANEOUS
  Filled 2015-10-19 (×3): qty 1

## 2015-10-19 MED ORDER — ACETAMINOPHEN 325 MG PO TABS: 650.0000 mg | ORAL_TABLET | ORAL | Status: DC | PRN

## 2015-10-19 MED ORDER — AMLODIPINE BESYLATE 10 MG PO TABS
10.0000 mg | ORAL_TABLET | Freq: Every day | ORAL | Status: DC
  Administered 2015-10-19 – 2015-10-20 (×2): 10 mg via ORAL
  Filled 2015-10-19 (×2): qty 1

## 2015-10-19 MED ORDER — ONDANSETRON HCL 4 MG PO TABS: 4.0000 mg | ORAL_TABLET | Freq: Four times a day (QID) | ORAL | Status: DC | PRN

## 2015-10-19 MED ORDER — ACETAMINOPHEN 325 MG PO TABS: 650.0000 mg | ORAL_TABLET | Freq: Four times a day (QID) | ORAL | Status: DC | PRN

## 2015-10-19 MED ORDER — INSULIN ASPART 100 UNIT/ML ~~LOC~~ SOLN
0.0000 [IU] | Freq: Every day | SUBCUTANEOUS | Status: DC
  Administered 2015-10-19: 2 [IU] via SUBCUTANEOUS

## 2015-10-19 MED ORDER — SODIUM CHLORIDE 0.9 % IJ SOLN
3.0000 mL | Freq: Two times a day (BID) | INTRAMUSCULAR | Status: DC
  Administered 2015-10-19 – 2015-10-21 (×3): 3 mL via INTRAVENOUS

## 2015-10-19 MED ORDER — SODIUM CHLORIDE 0.9 % IV SOLN
INTRAVENOUS | Status: DC
  Administered 2015-10-19 – 2015-10-20 (×2): via INTRAVENOUS

## 2015-10-19 MED ORDER — INSULIN ASPART 100 UNIT/ML ~~LOC~~ SOLN
0.0000 [IU] | Freq: Three times a day (TID) | SUBCUTANEOUS | Status: DC
  Administered 2015-10-20: 2 [IU] via SUBCUTANEOUS
  Administered 2015-10-20: 3 [IU] via SUBCUTANEOUS
  Administered 2015-10-20: 1 [IU] via SUBCUTANEOUS
  Administered 2015-10-21: 2 [IU] via SUBCUTANEOUS

## 2015-10-19 MED ORDER — BENAZEPRIL HCL 20 MG PO TABS
20.0000 mg | ORAL_TABLET | Freq: Every day | ORAL | Status: DC
  Administered 2015-10-19 – 2015-10-20 (×2): 20 mg via ORAL
  Filled 2015-10-19 (×4): qty 1

## 2015-10-19 MED ORDER — FUROSEMIDE 20 MG PO TABS
20.0000 mg | ORAL_TABLET | Freq: Every day | ORAL | Status: DC
Start: 2015-10-20 — End: 2015-10-20
  Filled 2015-10-19: qty 1

## 2015-10-19 MED ORDER — ONDANSETRON HCL 4 MG/2ML IJ SOLN: 4.0000 mg | Freq: Four times a day (QID) | INTRAMUSCULAR | Status: DC | PRN

## 2015-10-19 MED ORDER — NITROGLYCERIN 0.4 MG SL SUBL
0.4000 mg | SUBLINGUAL_TABLET | SUBLINGUAL | Status: DC | PRN
  Administered 2015-10-19: 0.4 mg via SUBLINGUAL
  Filled 2015-10-19: qty 1

## 2015-10-19 MED ORDER — CARVEDILOL 12.5 MG PO TABS
12.5000 mg | ORAL_TABLET | Freq: Two times a day (BID) | ORAL | Status: DC
  Administered 2015-10-19 – 2015-10-21 (×4): 12.5 mg via ORAL
  Filled 2015-10-19 (×4): qty 1

## 2015-10-19 MED ORDER — ASPIRIN 81 MG PO CHEW
81.0000 mg | CHEWABLE_TABLET | Freq: Every day | ORAL | Status: DC
  Administered 2015-10-19: 81 mg via ORAL
  Filled 2015-10-19: qty 1

## 2015-10-19 MED ORDER — ATORVASTATIN CALCIUM 40 MG PO TABS
40.0000 mg | ORAL_TABLET | Freq: Every day | ORAL | Status: DC
  Administered 2015-10-19 – 2015-10-20 (×2): 40 mg via ORAL
  Filled 2015-10-19 (×2): qty 1

## 2015-10-19 MED ORDER — PANTOPRAZOLE SODIUM 40 MG IV SOLR
40.0000 mg | Freq: Two times a day (BID) | INTRAVENOUS | Status: DC
  Administered 2015-10-19 (×2): 40 mg via INTRAVENOUS
  Filled 2015-10-19 (×2): qty 40

## 2015-10-19 NOTE — ED Notes (Signed)
EDP aware pt concerned about "not being able to swallow"

## 2015-10-19 NOTE — ED Notes (Signed)
Pt reports having chest pain but describes as pain started after eating breakfast and feels like there is "something stuck in her chest." pt unable to swallow and having spit her saliva out at triage. Airway intact, ekg done.

## 2015-10-19 NOTE — ED Notes (Signed)
Pt ambulated to restroom. 

## 2015-10-19 NOTE — H&P (Addendum)
[facility] History and Physical  Brenda Lenislthea E Mcclenton ZOX:[id number] DOB: [birth year] DOA: 10/19/2015  Referring physician: Dr Clydene PughKnott - [facility] PCP: Loreen FreudYvonne Lowne, DO   Chief Complaint: CP  HPI: Brenda Spence is a 82 y.o. female  Chest pain/esophageal pain. Started acutely this morning while patient was eating her breakfast. Pain was constant and worsened whenever she would try to eat. Substernal without radiation. Patient states that she feels like something was stuck in her throat or chest. Patient use her finger to gag herself to bring up food that she was eating with some relief of the sensation but pain persisted. Denies any palpitations, shortness of breath, diaphoresis. Some nausea associated with her symptoms but no emesis without being self-induced. Denies any recent change in her overall medical status or medications. States that from time to time. Will get stuck in her throat or she eats but this is by far the worst event. Endorses intermittent heartburn. Takes no medications for this   Review of Systems:  Constitutional:  No weight loss, night sweats, Fevers, chills, fatigue.  HEENT:  No headaches, Difficulty swallowing,Tooth/dental problems,Sore throat, Cardio-vascular:  No Orthopnea, PND, swelling in lower extremities, anasarca, dizziness, palpitations  GI: Per HPI Resp:   No shortness of breath with exertion or at rest. No excess mucus, no productive cough, No non-productive cough, No coughing up of blood.No change in color of mucus.No wheezing.No chest wall deformity  Skin:  no rash or lesions.  GU:  no dysuria, change in color of urine, no urgency or frequency. No flank pain.  Musculoskeletal:   No joint pain or swelling. No decreased range of motion. No back pain.  Psych:  No change in mood or affect. No depression or anxiety. No memory loss.  Neuro:  No change in sensation, unilateral strength, or cognitive abilities  All other systems were reviewed and are  negative.  Past Medical History  Diagnosis Date  . Post-menopausal   . CAD (coronary artery disease) 2006    [place]  . Broken ribs   . CHF (congestive heart failure) ([facility])   . Diabetes mellitus   . Hyperlipidemia   . Hypertension   . Arthritis   . Glaucoma    Past Surgical History  Procedure Laterality Date  . Breast surgery      fatty tissue  . Appendectomy    . Bladder surgery  1960  . Stent implant      heart   Social History:  reports that she has never smoked. She has never used smokeless tobacco. She reports that she does not drink alcohol or use illicit drugs.  No Known Allergies  Family History  Problem Relation Age of Onset  . Diabetes Sister   . Cancer Sister     stomach  . Stroke Brother   . Coronary artery disease Mother      Prior to Admission medications   Medication Sig Start Date End Date Taking? Authorizing Provider  amLODipine (NORVASC) 10 MG tablet TAKE ONE BY MOUTH DAILY Patient taking differently: Take 10 mg by mouth daily.  09/01/15  Yes Lelon PerlaYvonne R Lowne, DO  aspirin 81 MG tablet Take 81 mg by mouth daily.   Yes Historical Provider, MD  benazepril (LOTENSIN) 20 MG tablet Take 1 tablet (20 mg total) by mouth daily. 06/07/15  Yes Yvonne R Lowne, DO  carvedilol (COREG) 12.5 MG tablet TAKE TWO TABLETS IN AM AND TWO TABLES IN THE EVENING 10/18/15  Yes  Lelon Perla, DO  Cholecalciferol (VITAMIN D3) 3000 units TABS Take 3,000 Units by mouth daily.   Yes Historical Provider, MD  furosemide (LASIX) 40 MG tablet Take 1/2 tablet daily Patient taking differently: 20 mg daily.  06/07/15  Yes Yvonne R Lowne, DO  glucose blood (ONETOUCH VERIO) test strip Check Blood sugar twice a day. Dx: E11.9 03/17/15  Yes Lelon Perla, DO  glyBURIDE-metformin (GLUCOVANCE) 5-500 MG tablet Take 2 tablets by mouth 2 (two) times daily with a meal. 10/13/15  Yes Yvonne R Lowne, DO  latanoprost (XALATAN) 0.005 % ophthalmic solution Place 1 drop into  both eyes at bedtime.    Yes Historical Provider, MD  [facility] DELICA LANCETS FINE MISC Check blood sugar twice daily. Dx: E11.9 03/17/15   Lelon Perla, DO   Physical Exam: Filed Vitals:   10/19/15 1115 10/19/15 1200 10/19/15 1230 10/19/15 1315  BP: 196/86 164/71 178/65 182/71  Pulse: 78 70 70 71  Temp:      TempSrc:      Resp: 20 26 22 24   SpO2: 100% 98% 95% 97%    Wt Readings from Last 3 Encounters:  09/29/15 63.504 kg (140 lb)  05/18/15 61.961 kg (136 lb 9.6 oz)  05/04/15 61.871 kg (136 lb 6.4 oz)    General:  Appears calm and comfortable Eyes:  PERRL, EOMI, normal lids, iris ENT:  grossly normal hearing, lips & tongue Neck:  no LAD, masses or thyromegaly Cardiovascular:  RRR, 2/6 systolic murmur, 2+ distal pulses. No LE edema.  Respiratory:  CTA bilaterally, no w/r/r. Normal respiratory effort. Abdomen:  soft, ntnd, patient given Water which she took sips with there is obvious quality of patient swallow water. Skin:  no rash or induration seen on limited exam Musculoskeletal:  grossly normal tone BUE/BLE Psychiatric:  grossly normal mood and affect, speech fluent and appropriate Neurologic:  CN 2-12 grossly intact, moves all extremities in coordinated fashion.          Labs on Admission:  Basic Metabolic Panel:  Recent Labs Lab 10/19/15 1100  NA 142  K 4.3  CL 108  CO2 23  GLUCOSE 173*  BUN 20  CREATININE 1.26*  CALCIUM 9.6   Liver Function Tests: No results for input(s): AST, ALT, ALKPHOS, BILITOT, PROT, ALBUMIN in the last 168 hours. No results for input(s): LIPASE, AMYLASE in the last 168 hours. No results for input(s): AMMONIA in the last 168 hours. CBC:  Recent Labs Lab 10/19/15 1100  WBC 5.3  HGB 11.0*  HCT 33.2*  MCV 93.8  PLT 242   Cardiac Enzymes: No results for input(s): CKTOTAL, CKMB, CKMBINDEX, TROPONINI in the last 168 hours.  BNP (last 3 results) No results for input(s): BNP in the last 8760 hours.  ProBNP (last 3 results) No  results for input(s): PROBNP in the last 8760 hours.   Creatinine clearance cannot be calculated (Unknown ideal weight.)  CBG: No results for input(s): GLUCAP in the last 168 hours.  Radiological Exams on Admission: Dg Chest Portable 1 View  10/19/2015  CLINICAL DATA:  Mid chest pain. EXAM: PORTABLE CHEST 1 VIEW COMPARISON:  03/04/2012 FINDINGS: Heart size and pulmonary vascularity are normal and the lungs are clear except for slight scarring at the left lung base. Calcification in the thoracic aorta. No acute osseous abnormality. Moderate arthritis at the right glenohumeral joint. IMPRESSION: No acute abnormalities.  Aortic atherosclerosis. Electronically Signed   By: Francene Boyers M.D.   On: 10/19/2015 12:14  Assessment/Plan Principal Problem:   Chest pain Active Problems:   Diabetes mellitus type II, controlled (HCC)   Hyperlipidemia   Chronic diastolic (congestive) heart failure (H[facility])   Essential hypertension   History of MI (myocardial infarction)   CKD (chronic kidney disease) stage 2, GFR 60-89 ml/min  Chest pain: Etiology not immediately clear but suspect GI/possible esophageal stricture versus cardiac. Troponin normal. EKG without signs of ACS and unchanged from previous. History of MI and CAD status post cardiac catheterization with stent placement in 2008. Patient is not optimized from a medical standpoint with regards to her heart health. History highly suspicious for GI related etiology. HEART score 4. - Tele - cycle trop - EKG in am - Barium study stat - Protonix - Speech eval (NPO until eval)  ADDENDUM. BARIUM SWALLOW SHOWS SCHATZKI RING AND DELAYED PASSAGE OF CONTRAST. GI CONSULT IN AM TO ESTABLISH CARE. DYSPHAGIA DIET ORDERED   HTN: Elevated at time of admission likely due to not taking morning medications - Continue carvedilol, amlodipine, benazepril   Chronic diastolic congestive heart failure: Echo showing EF of 55% and grade 1 diastolic dysfunction. No  evidence of acute decompensation - Continue beta blocker, ACE inhibitor and Lasix  History of MI/CAD: s/p cath 2008 w/ stent. Patient was counseled to start on statin but refused due to concerns over liver injury. Lengthy discussion had with regards to benefit of statin - Continue aspirin - Start statin - lipid panel  CKD: Cr 1.26 baseline 1.2 - IVF - BMET in am  DM: A1c 7. Orals only - SSI  Code Status: FULL DVT Prophylaxis: Hep Family Communication: daughter Disposition Plan: Pending Improvement    MERRELL, DAVID J, MD Family Medicine [facility] [email or web address] Password [email or web address]

## 2015-10-19 NOTE — Progress Notes (Signed)
SLP Cancellation Note  Patient Details Name: Brenda Spence MRN: [id number] DOB: 1927/01/16   Cancelled treatment:       Reason Eval/Treat Not Completed: Patient at procedure or test/unavailable (transport arrived to take pt for testing)   Maxcine HamLaura Paiewonsky, M.A. CCC-SLP [phone]  Maxcine Hamaiewonsky, Natavia Sublette 10/19/2015, 3:13 PM

## 2015-10-19 NOTE — ED Provider Notes (Signed)
CSN: [id number]     Arrival date & time 10/19/15  1043 History   First MD Initiated Contact with Patient 10/19/15 1117     Chief Complaint  Patient presents with  . Chest Pain     (Consider location/radiation/quality/duration/timing/severity/associated sxs/prior Treatment) Patient is a 82 y.o. female presenting with chest pain. The history is provided by the patient.  Chest Pain Pain location:  Substernal area Pain quality: pressure   Pain radiates to:  Does not radiate Pain radiates to the back: no   Pain severity:  Severe Onset quality:  Sudden Duration:  2 hours Timing:  Constant Progression:  Unchanged Chronicity:  New Context: eating (ate 2 bites of food this morning) and at rest   Relieved by:  Nothing Worsened by:  Nothing tried Ineffective treatments:  None tried Associated symptoms: nausea (severe overwhelming)   Associated symptoms: no shortness of breath   Associated symptoms comment:  Odynophagia in chest Risk factors: coronary artery disease and hypertension     Past Medical History  Diagnosis Date  . Post-menopausal   . CAD (coronary artery disease) 2006    [place]  . Broken ribs   . CHF (congestive heart failure) (HCC)   . Diabetes mellitus   . Hyperlipidemia   . Hypertension   . Arthritis   . Glaucoma    Past Surgical History  Procedure Laterality Date  . Breast surgery      fatty tissue  . Appendectomy    . Bladder surgery  1960  . Stent implant      heart   Family History  Problem Relation Age of Onset  . Diabetes Sister   . Cancer Sister     stomach  . Stroke Brother   . Coronary artery disease Mother    Social History  Substance Use Topics  . Smoking status: Never Smoker   . Smokeless tobacco: Never Used  . Alcohol Use: No   OB History    Gravida Para Term Preterm AB TAB SAB Ectopic Multiple Living   4 4 4       3      Review of Systems  Respiratory: Negative for shortness of breath.   Cardiovascular:  Positive for chest pain.  Gastrointestinal: Positive for nausea (severe overwhelming).  All other systems reviewed and are negative.     Allergies  Review of patient's allergies indicates no known allergies.  Home Medications   Prior to Admission medications   Medication Sig Start Date End Date Taking? Authorizing Provider  amLODipine (NORVASC) 10 MG tablet TAKE ONE BY MOUTH DAILY Patient taking differently: Take 10 mg by mouth daily.  09/01/15  Yes Lelon PerlaYvonne R Lowne, DO  aspirin 81 MG tablet Take 81 mg by mouth daily.   Yes Historical Provider, MD  benazepril (LOTENSIN) 20 MG tablet Take 1 tablet (20 mg total) by mouth daily. 06/07/15  Yes Yvonne R Lowne, DO  carvedilol (COREG) 12.5 MG tablet TAKE TWO TABLETS IN AM AND TWO TABLES IN THE EVENING 10/18/15  Yes Grayling CongressYvonne R Lowne, DO  Cholecalciferol (VITAMIN D3) 3000 units TABS Take 3,000 Units by mouth daily.   Yes Historical Provider, MD  furosemide (LASIX) 40 MG tablet Take 1/2 tablet daily Patient taking differently: 20 mg daily.  06/07/15  Yes Yvonne R Lowne, DO  glucose blood (ONETOUCH VERIO) test strip Check Blood sugar twice a day. Dx: E11.9 03/17/15  Yes Yvonne R Lowne, DO  glyBURIDE-metformin (GLUCOVANCE) 5-500 MG tablet Take 2  tablets by mouth 2 (two) times daily with a meal. 10/13/15  Yes Yvonne R Lowne, DO  latanoprost (XALATAN) 0.005 % ophthalmic solution Place 1 drop into both eyes at bedtime.    Yes Historical Provider, MD  [facility] DELICA LANCETS FINE MISC Check blood sugar twice daily. Dx: E11.9 03/17/15   Grayling Congress Lowne, DO   BP 164/71 mmHg  Pulse 70  Temp(Src) 98.2 F (36.8 C) (Oral)  Resp 26  SpO2 98% Physical Exam  Constitutional: She is oriented to person, place, and time. She appears well-developed and well-nourished. She appears distressed (feels unwell).  HENT:  Head: Normocephalic.  Eyes: Conjunctivae are normal.  Neck: Neck supple. No tracheal deviation present.  Cardiovascular: Normal rate, regular rhythm and  normal heart sounds.   Pulmonary/Chest: Effort normal and breath sounds normal. No respiratory distress.  Abdominal: Soft. She exhibits no distension.  Neurological: She is alert and oriented to person, place, and time.  Skin: Skin is warm and dry.  Psychiatric: She has a normal mood and affect.    ED Course  Procedures (including critical care time) Labs Review Labs Reviewed  BASIC METABOLIC PANEL - Abnormal; Notable for the following:    Glucose, Bld 173 (*)    Creatinine, Ser 1.26 (*)    GFR calc non Af Amer 37 (*)    GFR calc Af Amer 43 (*)    All other components within normal limits  CBC - Abnormal; Notable for the following:    RBC 3.54 (*)    Hemoglobin 11.0 (*)    HCT 33.2 (*)    All other components within normal limits  I-STAT TROPOININ, ED    Imaging Review Dg Chest Portable 1 View  10/19/2015  CLINICAL DATA:  Mid chest pain. EXAM: PORTABLE CHEST 1 VIEW COMPARISON:  03/04/2012 FINDINGS: Heart size and pulmonary vascularity are normal and the lungs are clear except for slight scarring at the left lung base. Calcification in the thoracic aorta. No acute osseous abnormality. Moderate arthritis at the right glenohumeral joint. IMPRESSION: No acute abnormalities.  Aortic atherosclerosis. Electronically Signed   By: Francene Boyers M.D.   On: 10/19/2015 12:14   I have personally reviewed and evaluated these images and lab results as part of my medical decision-making.   EKG Interpretation   Date/Time:  Wednesday October 19 2015 10:47:09 EST Ventricular Rate:  84 PR Interval:  156 QRS Duration: 80 QT Interval:  362 QTC Calculation: 427 R Axis:   74 Text Interpretation:  Normal sinus rhythm Left ventricular hypertrophy  with repolarization abnormality Abnormal ECG No significant change since  last tracing Confirmed by Kamir Selover MD, Reuel Boom ([id number]) on 10/19/2015 11:21:38 AM      MDM   Final diagnoses:  Dysphagia    82 year old female with history of coronary artery  disease presents with central chest pressure described as feeling something being stuck in her chest. She had a small spoonful food this morning but has otherwise not had any meats or typical esophageal impaction-related foods. He was asymptomatic last night. No EKG changes noted, troponin is negative, patient was given aspirin and nitroglycerin and had relief of her symptoms. She is currently asymptomatic. Due to recent onset of pain and high risk demographic, will recommend observation stay for high-risk chest pain in case this represents unstable angina. Hospitalist was consulted for admission and will see the patient in the emergency department.   Lyndal Pulley, MD 10/20/15 [phone]

## 2015-10-19 NOTE — Progress Notes (Signed)
Pt son, Dr. Callie FieldingMoses Scahill [phone]), called for RN to update him. Son was concerned that cardiology had not been consulted, RN relayed that we are monitoring cardiac status closely and he will be updated with any changes. RN relayed [facility] were primary doc for now. Son very appreciative of update.   Will continue to monitor.   Edgardo RoysMcGrath, Santa Abdelrahman R

## 2015-10-20 ENCOUNTER — Encounter (HOSPITAL_COMMUNITY): Payer: Self-pay | Admitting: Physician Assistant

## 2015-10-20 DIAGNOSIS — I10 Essential (primary) hypertension: Secondary | ICD-10-CM | POA: Diagnosis not present

## 2015-10-20 DIAGNOSIS — R1310 Dysphagia, unspecified: Secondary | ICD-10-CM | POA: Diagnosis not present

## 2015-10-20 DIAGNOSIS — R933 Abnormal findings on diagnostic imaging of other parts of digestive tract: Secondary | ICD-10-CM

## 2015-10-20 DIAGNOSIS — K222 Esophageal obstruction: Secondary | ICD-10-CM | POA: Diagnosis not present

## 2015-10-20 LAB — CBC
HEMATOCRIT: 29.9 % — AB (ref 36.0–46.0)
HEMOGLOBIN: 10.1 g/dL — AB (ref 12.0–15.0)
MCH: 31.6 pg (ref 26.0–34.0)
MCHC: 33.8 g/dL (ref 30.0–36.0)
MCV: 93.4 fL (ref 78.0–100.0)
Platelets: 235 10*3/uL (ref 150–400)
RBC: 3.2 MIL/uL — ABNORMAL LOW (ref 3.87–5.11)
RDW: 11.8 % (ref 11.5–15.5)
WBC: 5.5 10*3/uL (ref 4.0–10.5)

## 2015-10-20 LAB — LIPID PANEL
CHOLESTEROL: 248 mg/dL — AB (ref 0–200)
HDL: 40 mg/dL — ABNORMAL LOW (ref >40)
LDL Cholesterol: 166 mg/dL — ABNORMAL HIGH (ref 0–99)
TRIGLYCERIDES: 210 mg/dL — AB (ref <150)
Total CHOL/HDL Ratio: 6.2 RATIO
VLDL: 42 mg/dL — ABNORMAL HIGH (ref 0–40)

## 2015-10-20 LAB — GLUCOSE, CAPILLARY
GLUCOSE-CAPILLARY: 202 mg/dL — AB (ref 65–99)
GLUCOSE-CAPILLARY: 178 mg/dL — AB (ref 65–99)
Glucose-Capillary: 124 mg/dL — ABNORMAL HIGH (ref 65–99)
Glucose-Capillary: 198 mg/dL — ABNORMAL HIGH (ref 65–99)

## 2015-10-20 LAB — BASIC METABOLIC PANEL
ANION GAP: 10 (ref 5–15)
BUN: 18 mg/dL (ref 6–20)
CHLORIDE: 109 mmol/L (ref 101–111)
CO2: 23 mmol/L (ref 22–32)
Calcium: 9.4 mg/dL (ref 8.9–10.3)
Creatinine, Ser: 1 mg/dL (ref 0.44–1.00)
GFR calc non Af Amer: 50 mL/min — ABNORMAL LOW (ref >60)
GFR, EST AFRICAN AMERICAN: 57 mL/min — AB (ref >60)
Glucose, Bld: 107 mg/dL — ABNORMAL HIGH (ref 65–99)
Potassium: 3.9 mmol/L (ref 3.5–5.1)
Sodium: 142 mmol/L (ref 135–145)

## 2015-10-20 MED ORDER — SODIUM CHLORIDE 0.9 % IV SOLN: INTRAVENOUS | Status: DC

## 2015-10-20 MED ORDER — PANTOPRAZOLE SODIUM 40 MG PO TBEC
40.0000 mg | DELAYED_RELEASE_TABLET | Freq: Every day | ORAL | Status: DC
  Administered 2015-10-20 – 2015-10-21 (×2): 40 mg via ORAL
  Filled 2015-10-20 (×2): qty 1

## 2015-10-20 NOTE — Care Management Obs Status (Signed)
MEDICARE OBSERVATION STATUS NOTIFICATION   Patient Details  Name: Brenda Spence MRN: [id number] Date of Birth: Apr 15, 1927   Medicare Observation Status Notification Given:  Yes    Cherylann ParrClaxton, Monserratt Knezevic S, RN 10/20/2015, 1:48 PM

## 2015-10-20 NOTE — Progress Notes (Signed)
[facility] PROGRESS NOTE  Hinda Lenislthea E Detzel BJY:[id number] DOB: [birth year] DOA: 10/19/2015 PCP: Loreen FreudYvonne Lowne, DO  Assessment/Plan:  Principal Problem:   Chest pain: MI ruled out. Sounds more like GI etiology. Has odynophagia, dysphagia, heartburn, and esophagram shows Schatzki ring at GE junction with delay of passage of barium tablet, as well as presbyesophagus. Will discontinue telemetry. Make nothing by mouth. Consulted [facility] for endoscopy. Will continue PPI. Never had upper endoscopy. [place] time ago in [place]. Active Problems:   Diabetes mellitus type II, controlled (HCC): Continue SSI   Hyperlipidemia: On statin   Chronic diastolic (congestive) heart failure (HCC), compensated   Essential hypertension, controlled   History of MI (myocardial infarction)   CKD (chronic kidney disease) stage 2, GFR 60-89 ml/min   Code Status:  full Family Communication:  Sister at bedside Disposition Plan:  Home after GI evaluation  HPI/Subjective: ate a good breakfast. Pain improved. Does endorse frequent dysphagia with food getting caught in her throat. Gets heartburn occasionally. No weight loss.  Objective: Filed Vitals:   10/20/15 0525 10/20/15 0809  BP: 172/63 173/63  Pulse: 71 75  Temp: 98.2 F (36.8 C)   Resp: 18     Intake/Output Summary (Last 24 hours) at 10/20/15 0948 Last data filed at 10/19/15 1800  Gross per 24 hour  Intake  66.25 ml  Output      0 ml  Net  66.25 ml   Filed Weights   10/19/15 1357  Weight: 62.5 kg (137 lb 12.6 oz)    Exam:   General:  Alert, oriented. Appears younger than stated age. Comfortable.  Cardiovascular: Regular rate rhythm without murmurs gallops rubs. No chest wall tenderness.  Respiratory: Clear to auscultation bilaterally without wheezes rhonchi or rales  Abdomen: Soft nontender nondistended  Ext: No clubbing cyanosis or edema  Basic Metabolic Panel:  Recent Labs Lab 10/19/15 1100 10/20/15 0359   NA 142 142  K 4.3 3.9  CL 108 109  CO2 23 23  GLUCOSE 173* 107*  BUN 20 18  CREATININE 1.26* 1.00  CALCIUM 9.6 9.4   Liver Function Tests: No results for input(s): AST, ALT, ALKPHOS, BILITOT, PROT, ALBUMIN in the last 168 hours. No results for input(s): LIPASE, AMYLASE in the last 168 hours. No results for input(s): AMMONIA in the last 168 hours. CBC:  Recent Labs Lab 10/19/15 1100 10/20/15 0359  WBC 5.3 5.5  HGB 11.0* 10.1*  HCT 33.2* 29.9*  MCV 93.8 93.4  PLT 242 235   Cardiac Enzymes:  Recent Labs Lab 10/19/15 1705 10/19/15 2056  TROPONINI <0.03 <0.03   BNP (last 3 results) No results for input(s): BNP in the last 8760 hours.  ProBNP (last 3 results) No results for input(s): PROBNP in the last 8760 hours.  CBG:  Recent Labs Lab 10/19/15 1639 10/19/15 2158 10/20/15 0724  GLUCAP 116* 211* 124*    No results found for this or any previous visit (from the past 240 hour(s)).   Studies: Dg Esophagus  10/19/2015  CLINICAL DATA:  Chest pain starting at breakfast.  Dysphagia. EXAM: ESOPHOGRAM/BARIUM SWALLOW TECHNIQUE: Single contrast examination was performed using  thin barium. FLUOROSCOPY TIME:  If the device does not provide the exposure index: Fluoroscopy Time:  2 minutes and 12 seconds Number of Acquired Images:  None COMPARISON:  None FINDINGS: Focused single-contrast exam was performed. Evaluation of esophageal motility demonstrates mild dysmotility with secondary escape wave in the upper esophagus. Full column evaluation demonstrates a small hiatal hernia.  Persistent mild to moderate narrowing at the gastroesophageal junction, most consistent with a mucosal ring. 13 mm barium tablet has delayed passage at the level of the gastroesophageal junction. Passes with subsequent swallows. IMPRESSION: 1. Small hiatal hernia. Mild narrowing at the gastroesophageal junction is most consistent with a Schatzki ring. This causes delayed passage of a 13 mm barium tablet,  suggesting potential clinical significance. 2. Mild for age esophageal dysmotility, likely presbyesophagus. Electronically Signed   By: Jeronimo Greaves M.D.   On: 10/19/2015 16:35   Dg Chest Portable 1 View  10/19/2015  CLINICAL DATA:  Mid chest pain. EXAM: PORTABLE CHEST 1 VIEW COMPARISON:  03/04/2012 FINDINGS: Heart size and pulmonary vascularity are normal and the lungs are clear except for slight scarring at the left lung base. Calcification in the thoracic aorta. No acute osseous abnormality. Moderate arthritis at the right glenohumeral joint. IMPRESSION: No acute abnormalities.  Aortic atherosclerosis. Electronically Signed   By: Francene Boyers M.D.   On: 10/19/2015 12:14    Scheduled Meds: . amLODipine  10 mg Oral Daily  . atorvastatin  40 mg Oral q1800  . benazepril  20 mg Oral Daily  . carvedilol  12.5 mg Oral BID WC  . insulin aspart  0-5 Units Subcutaneous QHS  . insulin aspart  0-9 Units Subcutaneous TID WC  . latanoprost  1 drop Both Eyes QHS  . pantoprazole (PROTONIX) IV  40 mg Intravenous Q12H  . sodium chloride  3 mL Intravenous Q12H   Continuous Infusions: . sodium chloride 75 mL/hr at 10/20/15 0809    Time spent: 35 minutes  Orlie Cundari L  [facility] [email or web address], password [facility] 10/20/2015, 9:48 AM

## 2015-10-20 NOTE — Care Management Note (Signed)
Case Management Note  Patient Details  Name: Brenda Spence MRN: [id number] Date of Birth: 07-23-1927  Subjective/Objective:  Pt admitted with chest pain                  Action/Plan:  Pt is from home independent alone.   Expected Discharge Date:                  Expected Discharge Plan:  Home w Home Health Services (Per pts daughter; pt is independent from home alone, pt off unit for procedure)  In-House Referral:     Discharge planning Services  CM Consult  Post Acute Care Choice:    Choice offered to:     DME Arranged:    DME Agency:     HH Arranged:    HH Agency:     Status of Service:  In process, will continue to follow  Medicare Important Message Given:    Date Medicare IM Given:    Medicare IM give by:    Date Additional Medicare IM Given:    Additional Medicare Important Message give by:     If discussed at Long Length of Stay Meetings, dates discussed:    Additional Comments: CM assessed pt. CM will continue to monitor for disposition needs Cherylann ParrClaxton, Marty Uy S, RN 10/20/2015, 2:30 PM

## 2015-10-20 NOTE — Evaluation (Signed)
Clinical/Bedside Swallow Evaluation Patient Details  Name: CEDRA VILLALON MRN: [id number] Date of Birth: 09/20/1927  Today's Date: 10/20/2015 Time: SLP Start Time (ACUTE ONLY): 0815 SLP Stop Time (ACUTE ONLY): 0830 SLP Time Calculation (min) (ACUTE ONLY): 15 min  Past Medical History:  Past Medical History  Diagnosis Date  . Post-menopausal   . CAD (coronary artery disease) 2006    PCI of RCA in [place], [place] stent placed  . Broken ribs   . CHF (congestive heart failure) (HCC)   . Diabetes mellitus   . Hyperlipidemia   . Hypertension   . Arthritis   . Glaucoma   . Fatty liver 01/2013    seen on CT.    Past Surgical History:  Past Surgical History  Procedure Laterality Date  . Breast surgery      fatty tissue  . Appendectomy    . Bladder surgery  1960  . Stent implant      heart   HPI:  Pt admitted with chest pain/esophageal pain. Started acutely this morning while patient was eating her breakfast. Pain was constant and worsened whenever she would try to eat. Substernal without radiation. Patient states that she feels like something was stuck in her throat or chest. Patient use her finger to gag herself to bring up food that she was eating with some relief of the sensation but pain persisted. Denies any palpitations, shortness of breath, diaphoresis. Some nausea associated with her symptoms but no emesis without being self-induced. Denies any recent change in her overall medical status or medications. States that from time to time. Will get stuck in her throat or she eats but this is by far the worst event. Endorses intermittent heartburn. Takes no medications for this. Esophagram on 10/18/14 shows mucosal ring at GE junction, presbyesophagus. GI referral pending this am.    Assessment / Plan / Recommendation Clinical Impression  Given findings of esophagram and no overt signs of aspiration, suspect pts dysphagia is esophageal with low risk of aspiration. Provided verbal and written  esophageal precautions. GI to visit pt today. No SLP f/u needed, all education complete, continue current diet.     Aspiration Risk  Mild aspiration risk    Diet Recommendation Dysphagia 3 (Mech soft);Thin liquid   Liquid Administration via: Straw;Cup Medication Administration: Whole meds with liquid Supervision: Patient able to self feed    Other  Recommendations Recommended Consults: Consider GI evaluation Oral Care Recommendations: Patient independent with oral care   Follow up Recommendations  None    Frequency and Duration            Prognosis        Swallow Study   General HPI: Pt admitted with chest pain/esophageal pain. Started acutely this morning while patient was eating her breakfast. Pain was constant and worsened whenever she would try to eat. Substernal without radiation. Patient states that she feels like something was stuck in her throat or chest. Patient use her finger to gag herself to bring up food that she was eating with some relief of the sensation but pain persisted. Denies any palpitations, shortness of breath, diaphoresis. Some nausea associated with her symptoms but no emesis without being self-induced. Denies any recent change in her overall medical status or medications. States that from time to time. Will get stuck in her throat or she eats but this is by far the worst event. Endorses intermittent heartburn. Takes no medications for this. Esophagram on 10/18/14 shows mucosal ring at GE junction, presbyesophagus.  GI referral pending this am.  Type of Study: Bedside Swallow Evaluation Previous Swallow Assessment: see HPI Diet Prior to this Study: Thin liquids;Dysphagia 3 (soft) Temperature Spikes Noted: No Respiratory Status: Room air History of Recent Intubation: No Behavior/Cognition: Alert;Cooperative;Pleasant mood Oral Cavity Assessment: Within Functional Limits Oral Care Completed by SLP: No Oral Cavity - Dentition: Adequate natural dentition Vision:  Functional for self-feeding Self-Feeding Abilities: Able to feed self Patient Positioning: Upright in bed Baseline Vocal Quality: Normal Volitional Cough: Strong Volitional Swallow: Able to elicit    Oral/Motor/Sensory Function Overall Oral Motor/Sensory Function: Within functional limits   Ice Chips     Thin Liquid Thin Liquid: Within functional limits    Nectar Thick     Honey Thick     Puree Puree: Not tested   Solid   GO    Solid: Within functional limits       Lanaysia Fritchman, Riley NearingBonnie Caroline 10/20/2015,10:31 AM

## 2015-10-20 NOTE — Consult Note (Signed)
[name]: 10:20 AM 10/20/2015     Referring Provider: Crista Curb  Primary Care Physician:  [name], DO Primary Gastroenterologist:  unassigned     Reason for Consultation:  odyno/dysphagia   HPI: Brenda Spence is a 82 y.o. female.  Hx CAD, s/p cardiac stenting; on 81 ASA.  Type 2 DM, controlled with oral meds.  CHF, EF 50% 10/2013 with grade 1 diastolic dysfunction and mild MVR.  CT 01/2013 showed fatty liver and 8 mm CBD.   12 months hx intermittent dysphagia and heartburn, on no PPI.  ~ 1 x per week she will stick fingers into throat to relieve food obstruction and then resume eating.  No change in appetite or weight.   Admitted early yesterday with post prandial chest pain.  Sensation of food being stuck in esophagus.  Pain not relieved with self vomiting effort.    Labs so far reveal normocytic anemia, elevated creatinine, normal cardiac enzymes and EKG.   Esophagram: small HH, dysmotility sugg of presbyesophagus and narrowing at GE jx/distal esophageal ring with delayed tablet passage.   Pt has full upper dentures but no issues with mastication.  No bleeding in regurgitated contents or in stool.  No use of NSAIDs.  No previous EGD and unremarkable colonoscopy ~ 10 yrs ago in [place].    Past Medical History  Diagnosis Date  . Post-menopausal   . CAD (coronary artery disease) 2006    PCI of RCA in [place], [place] stent placed  . Broken ribs   . CHF (congestive heart failure) (HCC)   . Diabetes mellitus   . Hyperlipidemia   . Hypertension   . Arthritis   . Glaucoma   . Fatty liver 01/2013    seen on CT.     Past Surgical History  Procedure Laterality Date  . Breast surgery      fatty tissue  . Appendectomy    . Bladder surgery  1960  . Stent implant      heart    Prior  to Admission medications   Medication Sig Start Date End Date Taking? Authorizing Provider  amLODipine (NORVASC) 10 MG tablet TAKE ONE BY MOUTH DAILY Patient taking differently: Take 10 mg by mouth daily.  09/01/15  Yes Lelon Perla, DO  aspirin 81 MG tablet Take 81 mg by mouth daily.   Yes Historical Provider, MD  benazepril (LOTENSIN) 20 MG tablet Take 1 tablet (20 mg total) by mouth daily. 06/07/15  Yes Yvonne R Lowne, DO  carvedilol (COREG) 12.5 MG tablet TAKE TWO TABLETS IN AM AND TWO TABLES IN THE EVENING 10/18/15  Yes Grayling Congress Lowne, DO  Cholecalciferol (VITAMIN D3) 3000 units TABS Take 3,000 Units by mouth daily.   Yes Historical Provider, MD  furosemide (LASIX) 40 MG tablet Take 1/2 tablet daily Patient taking differently: 20 mg daily.  06/07/15  Yes Yvonne R Lowne, DO  glucose blood (ONETOUCH VERIO) test strip Check Blood sugar twice a day. Dx: E11.9 03/17/15  Yes Yvonne R Lowne, DO  glyBURIDE-metformin (GLUCOVANCE) 5-500 MG  tablet Take 2 tablets by mouth 2 (two) times daily with a meal. 10/13/15  Yes Yvonne R Lowne, DO  latanoprost (XALATAN) 0.005 % ophthalmic solution Place 1 drop into both eyes at bedtime.    Yes Historical Provider, MD  [facility] DELICA LANCETS FINE MISC Check blood sugar twice daily. Dx: E11.9 03/17/15   Lelon Perla, DO    Scheduled Meds: . amLODipine  10 mg Oral Daily  . atorvastatin  40 mg Oral q1800  . benazepril  20 mg Oral Daily  . carvedilol  12.5 mg Oral BID WC  . insulin aspart  0-5 Units Subcutaneous QHS  . insulin aspart  0-9 Units Subcutaneous TID WC  . latanoprost  1 drop Both Eyes QHS  . pantoprazole (PROTONIX) IV  40 mg Intravenous Q12H  . sodium chloride  3 mL Intravenous Q12H   Infusions: . sodium chloride 75 mL/hr at 10/20/15 0809   PRN Meds: acetaminophen **OR** acetaminophen, acetaminophen, nitroGLYCERIN, ondansetron **OR** ondansetron (ZOFRAN) IV   Allergies as of 10/19/2015  . (No Known Allergies)    Family History  Problem  Relation Age of Onset  . Diabetes Sister   . Cancer Sister     stomach  . Stroke Brother   . Coronary artery disease Mother     Social History   Social History  . Marital Status: Widowed    Spouse Name: N/A  . Number of Children: 4  . Years of Education: N/A   Occupational History  . retired    Social History Main Topics  . Smoking status: Never Smoker   . Smokeless tobacco: Never Used  . Alcohol Use: No  . Drug Use: No  . Sexual Activity: No   Other Topics Concern  . Not on file   Social History Narrative   ** Merged History Encounter **        REVIEW OF SYSTEMS: Constitutional:  Stable energy level and weight ENT:  No nose bleeds Pulm:  No DOE, no cough CV:  No palpitations, no LE edema. Post prandial chest pain resolved GU:  No hematuria, no frequency GI:  Per HPI Heme:  No unusual bleeding or bruising   Transfusions:  At time of surgery in 1950s.    Neuro:  No headaches.  Rare LE paresthesia.  MS:  Occasional back pain, no use of NSAIDs.    Derm:  No itching, no rash or sores.  Endocrine:  Sugars run well into 200s.  Doses of oral meds doubled on recent visit to PMD.  No sweats or chills.  No polyuria or dysuria Immunization:  Reviewed, up to date flu and pneumovax.  Travel:  None beyond local counties in last few months.    PHYSICAL EXAM: Vital signs in last 24 hours: Filed Vitals:   10/20/15 0525 10/20/15 0809  BP: 172/63 173/63  Pulse: 71 75  Temp: 98.2 F (36.8 C)   Resp: 18    Wt Readings from Last 3 Encounters:  10/19/15 62.5 kg (137 lb 12.6 oz)  09/29/15 63.504 kg (140 lb)  05/18/15 61.961 kg (136 lb 9.6 oz)    General: pleasant, looks well.  Comfortable.   Head:  No asymmetry or swelling.    Eyes:  No icterus or pallor.  EOMI Ears:  Not HOH  Nose:  No congestion or discharge Mouth:  Moist, full upper denture in place.   Neck:  No mass, no TMG, no JVD Lungs:  Clear bil.  No cough or dyspnea.  Heart:  RRR.  No MRG.  S1/S2  audible Abdomen:  Soft, NT, ND.  No mass, hernia, bruits.   Rectal: deferred   Musc/Skeltl: no joint swelling, deformity or redness Extremities:  No CCE  Neurologic:  Oriented x 3.  No tremor or limb weakness.  Well spoken.   Skin:  No telangectasia, rash or sores Tattoos:  none Nodes:  No cervical or inguinal adenopathy.    Psych:  Pleasant, calm, engaged and cooperative.   Intake/Output from previous day: 01/04 0701 - 01/05 0700 In: 66.3 [I.V.:66.3] Out: -  Intake/Output this shift:    LAB RESULTS:  Recent Labs  10/19/15 1100 10/20/15 0359  WBC 5.3 5.5  HGB 11.0* 10.1*  HCT 33.2* 29.9*  PLT 242 235  MCV     93  BMET Lab Results  Component Value Date   NA 142 10/20/2015   NA 142 10/19/2015   NA 138 09/29/2015   K 3.9 10/20/2015   K 4.3 10/19/2015   K 4.0 09/29/2015   CL 109 10/20/2015   CL 108 10/19/2015   CL 101 09/29/2015   CO2 23 10/20/2015   CO2 23 10/19/2015   CO2 31 09/29/2015   GLUCOSE 107* 10/20/2015   GLUCOSE 173* 10/19/2015   GLUCOSE 172* 09/29/2015   BUN 18 10/20/2015   BUN 20 10/19/2015   BUN 27* 09/29/2015   CREATININE 1.00 10/20/2015   CREATININE 1.26* 10/19/2015   CREATININE 1.22* 09/29/2015   CALCIUM 9.4 10/20/2015   CALCIUM 9.6 10/19/2015   CALCIUM 9.5 09/29/2015   LFT No results for input(s): PROT, ALBUMIN, AST, ALT, ALKPHOS, BILITOT, BILIDIR, IBILI in the last 72 hours. PT/INR No results found for: INR, PROTIME  RADIOLOGY STUDIES: Dg Esophagus  10/19/2015  CLINICAL DATA:  Chest pain starting at breakfast.  Dysphagia. EXAM: ESOPHOGRAM/BARIUM SWALLOW TECHNIQUE: Single contrast examination was performed using  thin barium. FLUOROSCOPY TIME:  If the device does not provide the exposure index: Fluoroscopy Time:  2 minutes and 12 seconds Number of Acquired Images:  None COMPARISON:  None FINDINGS: Focused single-contrast exam was performed. Evaluation of esophageal motility demonstrates mild dysmotility with secondary escape wave in the  upper esophagus. Full column evaluation demonstrates a small hiatal hernia. Persistent mild to moderate narrowing at the gastroesophageal junction, most consistent with a mucosal ring. 13 mm barium tablet has delayed passage at the level of the gastroesophageal junction. Passes with subsequent swallows. IMPRESSION: 1. Small hiatal hernia. Mild narrowing at the gastroesophageal junction is most consistent with a Schatzki ring. This causes delayed passage of a 13 mm barium tablet, suggesting potential clinical significance. 2. Mild for age esophageal dysmotility, likely presbyesophagus. Electronically Signed   By: Jeronimo Greaves M.D.   On: 10/19/2015 16:35   Dg Chest Portable 1 View  10/19/2015  CLINICAL DATA:  Mid chest pain. EXAM: PORTABLE CHEST 1 VIEW COMPARISON:  03/04/2012 FINDINGS: Heart size and pulmonary vascularity are normal and the lungs are clear except for slight scarring at the left lung base. Calcification in the thoracic aorta. No acute osseous abnormality. Moderate arthritis at the right glenohumeral joint. IMPRESSION: No acute abnormalities.  Aortic atherosclerosis. Electronically Signed   By: Francene Boyers M.D.   On: 10/19/2015 12:14    ENDOSCOPIC STUDIES: Colonoscopy ~ 10 years ago in [place], no polyps.    IMPRESSION:   *  Dysphagia, odynophagia.  Non-cardiac chest pain.  Schatcki's ring, small HH, presbyesophagus and delayed tablet passage on esophagram 1/4.    *  Normocytic anemia. Current Hgb  down ~ 2 grams in last 2 years, 1 gram in last 7 months.  *  Hx CAD and cardiac stenting remotely, on 81 ASA but not platelet/thinner meds.    PLAN:     *  EGD with possible dilatation.  Set for 10 AM tomorrow with MAC.  Had solid AM meal ~ 8 AM today.  NPO after midnight.      Jennye MoccasinSarah Gribbin  10/20/2015, 10:20 AM Pager: [phone]  GI ATTENDING  History, laboratories, x-rays (including esophagram) reviewed. Patient seen and examined. Agree with comprehensive consultation as  outlined above. Elderly female with history of intermittent solid food dysphagia associated with discomfort. Esophagram appears to reveal a distal stricture ring with associated delay in passage of barium tablet. Plans for endoscopy with esophageal dilation in a.m. Thank you  Wilhemina BonitoJohn N. Eda KeysPerry, Jr., M.D. [facility] of G[facility]enter[facility]

## 2015-10-21 ENCOUNTER — Encounter (HOSPITAL_COMMUNITY): Payer: Self-pay | Admitting: *Deleted

## 2015-10-21 ENCOUNTER — Observation Stay (HOSPITAL_COMMUNITY): Payer: Medicare Other | Admitting: Anesthesiology

## 2015-10-21 ENCOUNTER — Encounter (HOSPITAL_COMMUNITY)
Admission: EM | Disposition: A | Discharge status: Home / Self Care | Payer: Self-pay | Source: Home / Self Care | Attending: Emergency Medicine

## 2015-10-21 DIAGNOSIS — I10 Essential (primary) hypertension: Secondary | ICD-10-CM | POA: Diagnosis not present

## 2015-10-21 DIAGNOSIS — R1310 Dysphagia, unspecified: Secondary | ICD-10-CM | POA: Insufficient documentation

## 2015-10-21 DIAGNOSIS — M19011 Primary osteoarthritis, right shoulder: Secondary | ICD-10-CM | POA: Diagnosis not present

## 2015-10-21 DIAGNOSIS — K222 Esophageal obstruction: Secondary | ICD-10-CM | POA: Diagnosis not present

## 2015-10-21 DIAGNOSIS — I2510 Atherosclerotic heart disease of native coronary artery without angina pectoris: Secondary | ICD-10-CM | POA: Diagnosis not present

## 2015-10-21 HISTORY — PX: ESOPHAGOGASTRODUODENOSCOPY: SHX5428

## 2015-10-21 HISTORY — PX: MALONEY DILATION: SHX5535

## 2015-10-21 LAB — GLUCOSE, CAPILLARY
GLUCOSE-CAPILLARY: 141 mg/dL — AB (ref 65–99)
GLUCOSE-CAPILLARY: 157 mg/dL — AB (ref 65–99)

## 2015-10-21 SURGERY — EGD (ESOPHAGOGASTRODUODENOSCOPY)
Anesthesia: Monitor Anesthesia Care

## 2015-10-21 MED ORDER — LIDOCAINE HCL (CARDIAC) 20 MG/ML IV SOLN
INTRAVENOUS | Status: DC | PRN
  Administered 2015-10-21: 40 mg via INTRAVENOUS

## 2015-10-21 MED ORDER — BENAZEPRIL HCL 20 MG PO TABS
20.0000 mg | ORAL_TABLET | Freq: Every day | ORAL | Status: DC
  Administered 2015-10-21: 20 mg via ORAL
  Filled 2015-10-21: qty 1

## 2015-10-21 MED ORDER — PROPOFOL 10 MG/ML IV BOLUS
INTRAVENOUS | Status: DC | PRN
  Administered 2015-10-21: 10 mg via INTRAVENOUS
  Administered 2015-10-21 (×2): 20 mg via INTRAVENOUS

## 2015-10-21 MED ORDER — PANTOPRAZOLE SODIUM 40 MG PO TBEC
40.0000 mg | DELAYED_RELEASE_TABLET | Freq: Every day | ORAL | Status: DC
Start: 2015-10-21 — End: 2016-03-29

## 2015-10-21 MED ORDER — LACTATED RINGERS IV SOLN
INTRAVENOUS | Status: DC
  Administered 2015-10-21: 10:00:00 via INTRAVENOUS

## 2015-10-21 MED ORDER — AMLODIPINE BESYLATE 10 MG PO TABS
10.0000 mg | ORAL_TABLET | Freq: Every day | ORAL | Status: DC
  Administered 2015-10-21: 10 mg via ORAL
  Filled 2015-10-21: qty 1

## 2015-10-21 MED ORDER — BUTAMBEN-TETRACAINE-BENZOCAINE 2-2-14 % EX AERO
INHALATION_SPRAY | CUTANEOUS | Status: DC | PRN
  Administered 2015-10-21: 2 via TOPICAL

## 2015-10-21 NOTE — Interval H&P Note (Signed)
History and Physical Interval Note:  10/21/2015 9:55 AM  Brenda Spence  has presented today for surgery, with the diagnosis of odynophagia, dysphagia.  The various methods of treatment have been discussed with the patient and family. After consideration of risks, benefits and other options for treatment, the patient has consented to  Procedure(s): ESOPHAGOGASTRODUODENOSCOPY (EGD) (N/A) MALONEY DILATION (N/A) , or Savary dilation, or balloon dilation as a surgical intervention .  The patient's history has been reviewed, patient examined, no change in status, stable for surgery.  I have reviewed the patient's chart and labs.  Questions were answered to the patient's satisfaction.     Yancey FlemingsJohn Tasharra Nodine

## 2015-10-21 NOTE — Anesthesia Postprocedure Evaluation (Signed)
Anesthesia Post Note  Patient: Brenda Spence  Procedure(s) Performed: Procedure(s) (LRB): ESOPHAGOGASTRODUODENOSCOPY (EGD) (N/A) MALONEY DILATION (N/A)  Patient location during evaluation: PACU Anesthesia Type: MAC Level of consciousness: awake and alert Pain management: pain level controlled Vital Signs Assessment: post-procedure vital signs reviewed and stable Respiratory status: spontaneous breathing Cardiovascular status: stable Anesthetic complications: no    Last Vitals:  Filed Vitals:   10/21/15 1020 10/21/15 1030  BP: 152/63 169/55  Pulse: 66 71  Temp:    Resp: 18 25    Last Pain:  Filed Vitals:   10/21/15 1039  PainSc: 0-No pain                 Lewie LoronJohn Azhane Eckart

## 2015-10-21 NOTE — Op Note (Signed)
[facility] [address] [place], [id number]   ENDOSCOPY PROCEDURE REPORT  PATIENT: [name], Brenda E  MR#: [id number] BIRTHDATE: 1927/07/09 , 88  yrs. old GENDER: female ENDOSCOPIST: Roxy CedarJohn N Tranae Laramie Jr, MD REFERRED BY:  [facility] PROCEDURE DATE:  10/21/2015 PROCEDURE:  EGD, diagnostic and Maloney dilation of esophagus   - [id number] ASA CLASS:     Class III INDICATIONS:  dysphagia and abnormal barium esophagogram. MEDICATIONS: Monitored anesthesia care and Per Anesthesia TOPICAL ANESTHETIC: none  DESCRIPTION OF PROCEDURE: After the risks benefits and alternatives of the procedure were thoroughly explained, informed consent was obtained.  The PENTAX GASTOROSCOPE [id number] endoscope was introduced through the mouth and advanced to the second portion of the duodenum , Without limitations.  The instrument was slowly withdrawn as the mucosa was fully examined.  EXAM:The esophagus revealed a benign ringlike stricture at the gastroesophageal junction.  No active inflammation.  This measured approximately 14 mm.the stomach was normal.  The duodenum was normal.  Retroflexed views revealed a hiatal hernia.     The scope was then withdrawn from the patient and the procedure completed. THERAPY: 54 French Maloney dilator was passed into the esophagus without resistance or heme. Tolerated well  COMPLICATIONS: There were no immediate complications.  ENDOSCOPIC IMPRESSION: 1. Benign distal esophageal ring status post dilation 2. Otherwise unremarkable EGD  RECOMMENDATIONS: 1. Clear liquids until 12 noon , then soft foods rest of day. Resume prior diet tomorrow. 2. GI follow-up as needed. Will sign off  REPEAT EXAM:  eSigned:  Roxy CedarJohn N Neilah Fulwider Jr, MD 10/21/2015 10:21 AM    CC:The Patient and Lelon PerlaYvonne R Lowne, DO

## 2015-10-21 NOTE — Discharge Summary (Signed)
Physician Discharge Summary  Brenda Spence:[id number] DOB: 13-Apr-1927 DOA: 10/19/2015  PCP: Brenda Freud, DO  Admit date: 10/19/2015 Discharge date: 10/21/2015  Time spent: Greater than 30 minutes  Recommendations for Outpatient Follow-up:  1.    Discharge Diagnoses:  Principal Problem:   Chest pain Active Problems:   Diabetes mellitus type II, controlled (HCC)   Hyperlipidemia   Chronic diastolic (congestive) heart failure (HCC)   Essential hypertension   History of MI (myocardial infarction)   CKD (chronic kidney disease) stage 2, GFR 60-89 ml/min   Dysphagia   Esophageal stricture   Discharge Condition: stable  Diet recommendation: carbohydrate modified. Heart healthy  Filed Weights   10/19/15 1357 10/21/15 0720  Weight: 62.5 kg (137 lb 12.6 oz) 62.2 kg (137 lb 2 oz)    History of present illness:  82 y.o. female  Chest pain/esophageal pain. Started acutely this morning while patient was eating her breakfast. Pain was constant and worsened whenever she would try to eat. Substernal without radiation. Patient states that she feels like something was stuck in her throat or chest. Patient use her finger to gag herself to bring up food that she was eating with some relief of the sensation but pain persisted. Denies any palpitations, shortness of breath, diaphoresis. Some nausea associated with her symptoms but no emesis without being self-induced. Denies any recent change in her overall medical status or medications. States that from time to time. Will get stuck in her throat or she eats but this is by far the worst event. Endorses intermittent heartburn. Takes no medications for this  Hospital Course:  Chest pain: MI ruled out. describedHas odynophagia, dysphagia, heartburn.  esophagram shows Schatzki ring at GE junction with delay of passage of barium tablet, as well as presbyesophagus. Consult to GI. Patient had EGD which showed benign distal esophageal ring which was  dilated. Patient's diet was advanced and cleared for discharge.  Other medical problems remain stable.  Procedures:  EGD with with distal esophageal ring dilatation  Consultations:  [facility]  Discharge Exam: Filed Vitals:   10/21/15 1030 10/21/15 1100  BP: 169/55 169/69  Pulse: 71 72  Temp:  97.4 F (36.3 C)  Resp: 25 18    General: Alert oriented comfortable Cardiovascular: Regular rate rhythm without murmurs gallops rubs Respiratory: Clear to auscultation bilaterally without wheezes rhonchi or rales Abdomen soft nontender nondistended Extremities no clubbing cyanosis or edema  Discharge Instructions   Discharge Instructions    Activity as tolerated - No restrictions    Complete by:  As directed      Diet - low sodium heart healthy    Complete by:  As directed      Diet Carb Modified    Complete by:  As directed           Current Discharge Medication List    START taking these medications   Details  pantoprazole (PROTONIX) 40 MG tablet Take 1 tablet (40 mg total) by mouth daily. Qty: 14 tablet, Refills: 0      CONTINUE these medications which have NOT CHANGED   Details  amLODipine (NORVASC) 10 MG tablet TAKE ONE BY MOUTH DAILY Qty: 90 tablet, Refills: 1    aspirin 81 MG tablet Take 81 mg by mouth daily.    benazepril (LOTENSIN) 20 MG tablet Take 1 tablet (20 mg total) by mouth daily. Qty: 90 tablet, Refills: 1    carvedilol (COREG) 12.5 MG tablet TAKE TWO TABLETS IN AM AND TWO TABLES  IN THE EVENING Qty: 270 tablet, Refills: 1    Cholecalciferol (VITAMIN D3) 3000 units TABS Take 3,000 Units by mouth daily.    furosemide (LASIX) 40 MG tablet Take 1/2 tablet daily Qty: 45 tablet, Refills: 1    glucose blood (ONETOUCH VERIO) test strip Check Blood sugar twice a day. Dx: E11.9 Qty: 100 each, Refills: prn    glyBURIDE-metformin (GLUCOVANCE) 5-500 MG tablet Take 2 tablets by mouth 2 (two) times daily with a meal. Qty: 360 tablet, Refills: 1     latanoprost (XALATAN) 0.005 % ophthalmic solution Place 1 drop into both eyes at bedtime.     ONETOUCH DELICA LANCETS FINE MISC Check blood sugar twice daily. Dx: E11.9 Qty: 100 each, Refills: prn       No Known Allergies Follow-up Information    Follow up with Brenda FreudYvonne Lowne, DO.   Specialty:  Family Medicine   Why:  As needed   Contact information:   [address] [place] [id number] [phone]        The results of significant diagnostics from this hospitalization (including imaging, microbiology, ancillary and laboratory) are listed below for reference.    Significant Diagnostic Studies: Dg Esophagus  10/19/2015  CLINICAL DATA:  Chest pain starting at breakfast.  Dysphagia. EXAM: ESOPHOGRAM/BARIUM SWALLOW TECHNIQUE: Single contrast examination was performed using  thin barium. FLUOROSCOPY TIME:  If the device does not provide the exposure index: Fluoroscopy Time:  2 minutes and 12 seconds Number of Acquired Images:  None COMPARISON:  None FINDINGS: Focused single-contrast exam was performed. Evaluation of esophageal motility demonstrates mild dysmotility with secondary escape wave in the upper esophagus. Full column evaluation demonstrates a small hiatal hernia. Persistent mild to moderate narrowing at the gastroesophageal junction, most consistent with a mucosal ring. 13 mm barium tablet has delayed passage at the level of the gastroesophageal junction. Passes with subsequent swallows. IMPRESSION: 1. Small hiatal hernia. Mild narrowing at the gastroesophageal junction is most consistent with a Schatzki ring. This causes delayed passage of a 13 mm barium tablet, suggesting potential clinical significance. 2. Mild for age esophageal dysmotility, likely presbyesophagus. Electronically Signed   By: Jeronimo GreavesKyle  Talbot M.D.   On: 10/19/2015 16:35   Dg Chest Portable 1 View  10/19/2015  CLINICAL DATA:  Mid chest pain. EXAM: PORTABLE CHEST 1 VIEW COMPARISON:  03/04/2012 FINDINGS:  Heart size and pulmonary vascularity are normal and the lungs are clear except for slight scarring at the left lung base. Calcification in the thoracic aorta. No acute osseous abnormality. Moderate arthritis at the right glenohumeral joint. IMPRESSION: No acute abnormalities.  Aortic atherosclerosis. Electronically Signed   By: Francene BoyersJames  Maxwell M.D.   On: 10/19/2015 12:14    Microbiology: No results found for this or any previous visit (from the past 240 hour(s)).   Labs: Basic Metabolic Panel:  Recent Labs Lab 10/19/15 1100 10/20/15 0359  NA 142 142  K 4.3 3.9  CL 108 109  CO2 23 23  GLUCOSE 173* 107*  BUN 20 18  CREATININE 1.26* 1.00  CALCIUM 9.6 9.4   Liver Function Tests: No results for input(s): AST, ALT, ALKPHOS, BILITOT, PROT, ALBUMIN in the last 168 hours. No results for input(s): LIPASE, AMYLASE in the last 168 hours. No results for input(s): AMMONIA in the last 168 hours. CBC:  Recent Labs Lab 10/19/15 1100 10/20/15 0359  WBC 5.3 5.5  HGB 11.0* 10.1*  HCT 33.2* 29.9*  MCV 93.8 93.4  PLT 242 235  Cardiac Enzymes:  Recent Labs Lab 10/19/15 1705 10/19/15 2056  TROPONINI <0.03 <0.03   BNP: BNP (last 3 results) No results for input(s): BNP in the last 8760 hours.  ProBNP (last 3 results) No results for input(s): PROBNP in the last 8760 hours.  CBG:  Recent Labs Lab 10/20/15 1117 10/20/15 1642 10/20/15 2131 10/21/15 0605 10/21/15 1202  GLUCAP 202* 178* 198* 141* 157*       Signed:  Christiane Ha MD [facility] 10/21/2015, 1:26 PM

## 2015-10-21 NOTE — Progress Notes (Signed)
Pt. Discharged home alert and oriented. Discharge med. List and instruction discussed with teach back.

## 2015-10-21 NOTE — Anesthesia Preprocedure Evaluation (Addendum)
Anesthesia Evaluation  Patient identified by MRN, date of birth, ID band Patient awake    Reviewed: Allergy & Precautions, NPO status , Patient's Chart, lab work & pertinent test results  Airway Mallampati: II  TM Distance: >3 FB Neck ROM: Full    Dental no notable dental hx.    Pulmonary neg pulmonary ROS,    Pulmonary exam normal breath sounds clear to auscultation       Cardiovascular hypertension, Pt. on medications + CAD and +CHF  Normal cardiovascular exam Rhythm:Regular Rate:Normal  Echo 2015 - Left ventricle: The cavity size was normal. Wall thicknesswas normal. Systolic function was normal. The estimatedejection fraction was 50%, in the range of 50% to 55%.Wall motion was normal; there were no regional wall motionabnormalities. Doppler parameters are consistent with abnormal left ventricular relaxation (grade 1 diastolicdysfunction). Doppler parameters are consistent with highventricular filling pressure. - Mitral valve: Mild regurgitation. - Pulmonary arteries: Systolic pressure was mildly increased. PA peak pressure: 33mm Hg (S).    Neuro/Psych negative neurological ROS  negative psych ROS   GI/Hepatic negative GI ROS, Neg liver ROS,   Endo/Other  diabetes, Type 2  Renal/GU Renal disease     Musculoskeletal negative musculoskeletal ROS (+) Arthritis ,   Abdominal   Peds  Hematology  (+) anemia ,   Anesthesia Other Findings   Reproductive/Obstetrics negative OB ROS                            Anesthesia Physical Anesthesia Plan  ASA: III  Anesthesia Plan: MAC   Post-op Pain Management:    Induction: Intravenous  Airway Management Planned:   Additional Equipment:   Intra-op Plan:   Post-operative Plan:   Informed Consent: I have reviewed the patients History and Physical, chart, labs and discussed the procedure including the risks, benefits and alternatives  for the proposed anesthesia with the patient or authorized representative who has indicated his/her understanding and acceptance.   Dental advisory given  Plan Discussed with: CRNA  Anesthesia Plan Comments:         Anesthesia Quick Evaluation

## 2015-10-21 NOTE — H&P (View-Only) (Signed)
[facility] Consult: 10:20 AM 10/20/2015     Referring Provider: Crista Curb  Primary Care Physician:  [name], DO Primary Gastroenterologist:  unassigned     Reason for Consultation:  odyno/dysphagia   HPI: Brenda Spence is a 82 y.o. female.  Hx CAD, s/p cardiac stenting; on 81 ASA.  Type 2 DM, controlled with oral meds.  CHF, EF 50% 10/2013 with grade 1 diastolic dysfunction and mild MVR.  CT 01/2013 showed fatty liver and 8 mm CBD.   12 months hx intermittent dysphagia and heartburn, on no PPI.  ~ 1 x per week she will stick fingers into throat to relieve food obstruction and then resume eating.  No change in appetite or weight.   Admitted early yesterday with post prandial chest pain.  Sensation of food being stuck in esophagus.  Pain not relieved with self vomiting effort.    Labs so far reveal normocytic anemia, elevated creatinine, normal cardiac enzymes and EKG.   Esophagram: small HH, dysmotility sugg of presbyesophagus and narrowing at GE jx/distal esophageal ring with delayed tablet passage.   Pt has full upper dentures but no issues with mastication.  No bleeding in regurgitated contents or in stool.  No use of NSAIDs.  No previous EGD and unremarkable colonoscopy ~ 10 yrs ago in [place].    Past Medical History  Diagnosis Date  . Post-menopausal   . CAD (coronary artery disease) 2006    PCI of RCA in [place], [place] stent placed  . Broken ribs   . CHF (congestive heart failure) (HCC)   . Diabetes mellitus   . Hyperlipidemia   . Hypertension   . Arthritis   . Glaucoma   . Fatty liver 01/2013    seen on CT.     Past Surgical History  Procedure Laterality Date  . Breast surgery      fatty tissue  . Appendectomy    . Bladder surgery  1960  . Stent implant      heart    Prior  to Admission medications   Medication Sig Start Date End Date Taking? Authorizing Provider  amLODipine (NORVASC) 10 MG tablet TAKE ONE BY MOUTH DAILY Patient taking differently: Take 10 mg by mouth daily.  09/01/15  Yes Lelon Perla, DO  aspirin 81 MG tablet Take 81 mg by mouth daily.   Yes Historical Provider, MD  benazepril (LOTENSIN) 20 MG tablet Take 1 tablet (20 mg total) by mouth daily. 06/07/15  Yes Yvonne R Lowne, DO  carvedilol (COREG) 12.5 MG tablet TAKE TWO TABLETS IN AM AND TWO TABLES IN THE EVENING 10/18/15  Yes Grayling Congress Lowne, DO  Cholecalciferol (VITAMIN D3) 3000 units TABS Take 3,000 Units by mouth daily.   Yes Historical Provider, MD  furosemide (LASIX) 40 MG tablet Take 1/2 tablet daily Patient taking differently: 20 mg daily.  06/07/15  Yes Yvonne R Lowne, DO  glucose blood (ONETOUCH VERIO) test strip Check Blood sugar twice a day. Dx: E11.9 03/17/15  Yes Yvonne R Lowne, DO  glyBURIDE-metformin (GLUCOVANCE) 5-500 MG  tablet Take 2 tablets by mouth 2 (two) times daily with a meal. 10/13/15  Yes Yvonne R Lowne, DO  latanoprost (XALATAN) 0.005 % ophthalmic solution Place 1 drop into both eyes at bedtime.    Yes Historical Provider, MD  [facility] DELICA LANCETS FINE MISC Check blood sugar twice daily. Dx: E11.9 03/17/15   Lelon Perla, DO    Scheduled Meds: . amLODipine  10 mg Oral Daily  . atorvastatin  40 mg Oral q1800  . benazepril  20 mg Oral Daily  . carvedilol  12.5 mg Oral BID WC  . insulin aspart  0-5 Units Subcutaneous QHS  . insulin aspart  0-9 Units Subcutaneous TID WC  . latanoprost  1 drop Both Eyes QHS  . pantoprazole (PROTONIX) IV  40 mg Intravenous Q12H  . sodium chloride  3 mL Intravenous Q12H   Infusions: . sodium chloride 75 mL/hr at 10/20/15 0809   PRN Meds: acetaminophen **OR** acetaminophen, acetaminophen, nitroGLYCERIN, ondansetron **OR** ondansetron (ZOFRAN) IV   Allergies as of 10/19/2015  . (No Known Allergies)    Family History  Problem  Relation Age of Onset  . Diabetes Sister   . Cancer Sister     stomach  . Stroke Brother   . Coronary artery disease Mother     Social History   Social History  . Marital Status: Widowed    Spouse Name: N/A  . Number of Children: 4  . Years of Education: N/A   Occupational History  . retired    Social History Main Topics  . Smoking status: Never Smoker   . Smokeless tobacco: Never Used  . Alcohol Use: No  . Drug Use: No  . Sexual Activity: No   Other Topics Concern  . Not on file   Social History Narrative   ** Merged History Encounter **        REVIEW OF SYSTEMS: Constitutional:  Stable energy level and weight ENT:  No nose bleeds Pulm:  No DOE, no cough CV:  No palpitations, no LE edema. Post prandial chest pain resolved GU:  No hematuria, no frequency GI:  Per HPI Heme:  No unusual bleeding or bruising   Transfusions:  At time of surgery in 1950s.    Neuro:  No headaches.  Rare LE paresthesia.  MS:  Occasional back pain, no use of NSAIDs.    Derm:  No itching, no rash or sores.  Endocrine:  Sugars run well into 200s.  Doses of oral meds doubled on recent visit to PMD.  No sweats or chills.  No polyuria or dysuria Immunization:  Reviewed, up to date flu and pneumovax.  Travel:  None beyond local counties in last few months.    PHYSICAL EXAM: Vital signs in last 24 hours: Filed Vitals:   10/20/15 0525 10/20/15 0809  BP: 172/63 173/63  Pulse: 71 75  Temp: 98.2 F (36.8 C)   Resp: 18    Wt Readings from Last 3 Encounters:  10/19/15 62.5 kg (137 lb 12.6 oz)  09/29/15 63.504 kg (140 lb)  05/18/15 61.961 kg (136 lb 9.6 oz)    General: pleasant, looks well.  Comfortable.   Head:  No asymmetry or swelling.    Eyes:  No icterus or pallor.  EOMI Ears:  Not HOH  Nose:  No congestion or discharge Mouth:  Moist, full upper denture in place.   Neck:  No mass, no TMG, no JVD Lungs:  Clear bil.  No cough or dyspnea.  Heart:  RRR.  No MRG.  S1/S2  audible Abdomen:  Soft, NT, ND.  No mass, hernia, bruits.   Rectal: deferred   Musc/Skeltl: no joint swelling, deformity or redness Extremities:  No CCE  Neurologic:  Oriented x 3.  No tremor or limb weakness.  Well spoken.   Skin:  No telangectasia, rash or sores Tattoos:  none Nodes:  No cervical or inguinal adenopathy.    Psych:  Pleasant, calm, engaged and cooperative.   Intake/Output from previous day: 01/04 0701 - 01/05 0700 In: 66.3 [I.V.:66.3] Out: -  Intake/Output this shift:    LAB RESULTS:  Recent Labs  10/19/15 1100 10/20/15 0359  WBC 5.3 5.5  HGB 11.0* 10.1*  HCT 33.2* 29.9*  PLT 242 235  MCV     93  BMET Lab Results  Component Value Date   NA 142 10/20/2015   NA 142 10/19/2015   NA 138 09/29/2015   K 3.9 10/20/2015   K 4.3 10/19/2015   K 4.0 09/29/2015   CL 109 10/20/2015   CL 108 10/19/2015   CL 101 09/29/2015   CO2 23 10/20/2015   CO2 23 10/19/2015   CO2 31 09/29/2015   GLUCOSE 107* 10/20/2015   GLUCOSE 173* 10/19/2015   GLUCOSE 172* 09/29/2015   BUN 18 10/20/2015   BUN 20 10/19/2015   BUN 27* 09/29/2015   CREATININE 1.00 10/20/2015   CREATININE 1.26* 10/19/2015   CREATININE 1.22* 09/29/2015   CALCIUM 9.4 10/20/2015   CALCIUM 9.6 10/19/2015   CALCIUM 9.5 09/29/2015   LFT No results for input(s): PROT, ALBUMIN, AST, ALT, ALKPHOS, BILITOT, BILIDIR, IBILI in the last 72 hours. PT/INR No results found for: INR, PROTIME  RADIOLOGY STUDIES: Dg Esophagus  10/19/2015  CLINICAL DATA:  Chest pain starting at breakfast.  Dysphagia. EXAM: ESOPHOGRAM/BARIUM SWALLOW TECHNIQUE: Single contrast examination was performed using  thin barium. FLUOROSCOPY TIME:  If the device does not provide the exposure index: Fluoroscopy Time:  2 minutes and 12 seconds Number of Acquired Images:  None COMPARISON:  None FINDINGS: Focused single-contrast exam was performed. Evaluation of esophageal motility demonstrates mild dysmotility with secondary escape wave in the  upper esophagus. Full column evaluation demonstrates a small hiatal hernia. Persistent mild to moderate narrowing at the gastroesophageal junction, most consistent with a mucosal ring. 13 mm barium tablet has delayed passage at the level of the gastroesophageal junction. Passes with subsequent swallows. IMPRESSION: 1. Small hiatal hernia. Mild narrowing at the gastroesophageal junction is most consistent with a Schatzki ring. This causes delayed passage of a 13 mm barium tablet, suggesting potential clinical significance. 2. Mild for age esophageal dysmotility, likely presbyesophagus. Electronically Signed   By: Jeronimo Greaves M.D.   On: 10/19/2015 16:35   Dg Chest Portable 1 View  10/19/2015  CLINICAL DATA:  Mid chest pain. EXAM: PORTABLE CHEST 1 VIEW COMPARISON:  03/04/2012 FINDINGS: Heart size and pulmonary vascularity are normal and the lungs are clear except for slight scarring at the left lung base. Calcification in the thoracic aorta. No acute osseous abnormality. Moderate arthritis at the right glenohumeral joint. IMPRESSION: No acute abnormalities.  Aortic atherosclerosis. Electronically Signed   By: Francene Boyers M.D.   On: 10/19/2015 12:14    ENDOSCOPIC STUDIES: Colonoscopy ~ 10 years ago in [place], no polyps.    IMPRESSION:   *  Dysphagia, odynophagia.  Non-cardiac chest pain.  Schatcki's ring, small HH, presbyesophagus and delayed tablet passage on esophagram 1/4.    *  Normocytic anemia. Current Hgb  down ~ 2 grams in last 2 years, 1 gram in last 7 months.  *  Hx CAD and cardiac stenting remotely, on 81 ASA but not platelet/thinner meds.    PLAN:     *  EGD with possible dilatation.  Set for 10 AM tomorrow with MAC.  Had solid AM meal ~ 8 AM today.  NPO after midnight.      Jennye MoccasinSarah Gribbin  10/20/2015, 10:20 AM Pager: [phone]  GI ATTENDING  History, laboratories, x-rays (including esophagram) reviewed. Patient seen and examined. Agree with comprehensive consultation as  outlined above. Elderly female with history of intermittent solid food dysphagia associated with discomfort. Esophagram appears to reveal a distal stricture ring with associated delay in passage of barium tablet. Plans for endoscopy with esophageal dilation in a.m. Thank you  Wilhemina BonitoJohn N. Eda KeysPerry, Jr., M.D. [facility]

## 2015-10-21 NOTE — Transfer of Care (Signed)
Immediate Anesthesia Transfer of Care Note  Patient: Brenda Spence  Procedure(s) Performed: Procedure(s): ESOPHAGOGASTRODUODENOSCOPY (EGD) (N/A) MALONEY DILATION (N/A)  Patient Location: Endoscopy Unit  Anesthesia Type:MAC  Level of Consciousness: awake, alert  and oriented  Airway & Oxygen Therapy: Patient Spontanous Breathing and Patient connected to nasal cannula oxygen  Post-op Assessment: Report given to RN and Post -op Vital signs reviewed and stable  Post vital signs: Reviewed and stable  Last Vitals:  Filed Vitals:   10/21/15 0840 10/21/15 0939  BP: 174/78 195/64  Pulse: 71 68  Temp: 36.6 C 36.7 C  Resp: 18 24    Complications: No apparent anesthesia complications

## 2015-10-24 ENCOUNTER — Encounter (HOSPITAL_COMMUNITY): Payer: Self-pay | Admitting: Internal Medicine

## 2015-11-01 ENCOUNTER — Telehealth: Payer: Self-pay | Admitting: Family Medicine

## 2015-11-01 NOTE — Telephone Encounter (Signed)
Relation to pt: self Call back number: [phone]   Reason for call:  Patient would like to know if she can take arthritis tylenol  Patient was concerned if it would conflict with her current medication

## 2015-11-01 NOTE — Telephone Encounter (Signed)
Spoke with patient and verbalized and she agreed, she said she was having LBP and wanted to make sure. I made her aware to only take the medication as needed and she has agreed to do so.     KP

## 2015-11-01 NOTE — Telephone Encounter (Signed)
She can take tylenol

## 2015-11-01 NOTE — Telephone Encounter (Signed)
Please advise      KP 

## 2015-11-02 NOTE — Progress Notes (Signed)
   10/20/15 0900  SLP G-Codes **NOT FOR INPATIENT CLASS**  Functional Assessment Tool Used clinical judgement  Functional Limitations Swallowing  Swallow Current Status (W0981) CI  Swallow Goal Status (X9147) CI  Swallow Discharge Status (W2956) CI  SLP Evaluations  $ SLP Speech Visit 1 Procedure  SLP Evaluations  $BSS Swallow 1 Procedure  $Swallowing Treatment 1 Procedure

## 2015-11-04 ENCOUNTER — Ambulatory Visit (HOSPITAL_BASED_OUTPATIENT_CLINIC_OR_DEPARTMENT_OTHER)
Admission: RE | Admit: 2015-11-04 | Discharge: 2015-11-04 | Disposition: A | Discharge status: Home / Self Care | Payer: Medicare Other | Source: Ambulatory Visit | Attending: Family Medicine | Admitting: Family Medicine

## 2015-11-04 DIAGNOSIS — E2839 Other primary ovarian failure: Secondary | ICD-10-CM | POA: Diagnosis not present

## 2015-11-04 DIAGNOSIS — M8580 Other specified disorders of bone density and structure, unspecified site: Secondary | ICD-10-CM | POA: Diagnosis not present

## 2015-11-04 DIAGNOSIS — Z780 Asymptomatic menopausal state: Secondary | ICD-10-CM | POA: Diagnosis not present

## 2015-11-09 ENCOUNTER — Other Ambulatory Visit: Payer: Self-pay

## 2015-11-09 MED ORDER — CALCIUM 1200 1200-1000 MG-UNIT PO CHEW: 1.0000 | CHEWABLE_TABLET | Freq: Every day | ORAL | Status: DC

## 2015-11-28 ENCOUNTER — Other Ambulatory Visit: Payer: Self-pay

## 2015-11-28 MED ORDER — BENAZEPRIL HCL 20 MG PO TABS: 20.0000 mg | ORAL_TABLET | Freq: Every day | ORAL | Status: DC

## 2015-11-28 MED ORDER — FUROSEMIDE 40 MG PO TABS: ORAL_TABLET | ORAL | Status: DC

## 2015-12-07 ENCOUNTER — Ambulatory Visit: Payer: PRIVATE HEALTH INSURANCE

## 2015-12-19 ENCOUNTER — Ambulatory Visit: Payer: PRIVATE HEALTH INSURANCE

## 2016-01-05 ENCOUNTER — Telehealth: Payer: Self-pay | Admitting: Family Medicine

## 2016-01-05 DIAGNOSIS — E785 Hyperlipidemia, unspecified: Secondary | ICD-10-CM

## 2016-01-05 DIAGNOSIS — E119 Type 2 diabetes mellitus without complications: Secondary | ICD-10-CM

## 2016-01-05 MED ORDER — GLYBURIDE-METFORMIN 5-500 MG PO TABS: 2.0000 | ORAL_TABLET | Freq: Two times a day (BID) | ORAL | Status: DC

## 2016-01-05 NOTE — Telephone Encounter (Signed)
Spoke with patient and she said the pharmacy is only giving her 60 pills. I advised she should have #360, She said she thought so. Rx faxed with the corrected directions.  KP

## 2016-01-05 NOTE — Telephone Encounter (Signed)
Pt is requesting a call back, she says that she didn't receive her full refill on her Metformin Rx. Pt is requesting a call back to discuss further   CB: [phone]

## 2016-01-10 ENCOUNTER — Other Ambulatory Visit (INDEPENDENT_AMBULATORY_CARE_PROVIDER_SITE_OTHER): Payer: Medicare Other

## 2016-01-10 DIAGNOSIS — E785 Hyperlipidemia, unspecified: Secondary | ICD-10-CM | POA: Diagnosis not present

## 2016-01-10 DIAGNOSIS — E119 Type 2 diabetes mellitus without complications: Secondary | ICD-10-CM | POA: Diagnosis not present

## 2016-01-10 LAB — COMPREHENSIVE METABOLIC PANEL
ALT: 13 U/L (ref 0–35)
AST: 20 U/L (ref 0–37)
Albumin: 4.1 g/dL (ref 3.5–5.2)
Alkaline Phosphatase: 55 U/L (ref 39–117)
BILIRUBIN TOTAL: 0.6 mg/dL (ref 0.2–1.2)
BUN: 15 mg/dL (ref 6–23)
CO2: 27 meq/L (ref 19–32)
CREATININE: 1.22 mg/dL — AB (ref 0.40–1.20)
Calcium: 10.1 mg/dL (ref 8.4–10.5)
Chloride: 104 mEq/L (ref 96–112)
GFR: 44.37 mL/min — ABNORMAL LOW (ref >60.00)
GLUCOSE: 119 mg/dL — AB (ref 70–99)
Potassium: 4.3 mEq/L (ref 3.5–5.1)
Sodium: 139 mEq/L (ref 135–145)
Total Protein: 7.6 g/dL (ref 6.0–8.3)

## 2016-01-10 LAB — LIPID PANEL
CHOL/HDL RATIO: 6
Cholesterol: 286 mg/dL — ABNORMAL HIGH (ref 0–200)
HDL: 44.8 mg/dL (ref >39.00)
LDL Cholesterol: 208 mg/dL — ABNORMAL HIGH (ref 0–99)
NONHDL: 241.52
Triglycerides: 166 mg/dL — ABNORMAL HIGH (ref 0.0–149.0)
VLDL: 33.2 mg/dL (ref 0.0–40.0)

## 2016-01-10 LAB — HEMOGLOBIN A1C: Hgb A1c MFr Bld: 6.3 % (ref 4.6–6.5)

## 2016-01-13 ENCOUNTER — Other Ambulatory Visit: Payer: Self-pay | Admitting: *Deleted

## 2016-01-13 NOTE — Telephone Encounter (Signed)
Ok to switch to glipizide / met 5/500

## 2016-01-13 NOTE — Telephone Encounter (Signed)
Received fax informing that Insurance will no longer cover glyburide-metformin 5-500mg ; the only Alternative formulary is: Glipizide-Metformin/SLS

## 2016-01-16 NOTE — Telephone Encounter (Signed)
New Rx request to pharmacy; Patient informed, understood & agreed/SLS 04/03  Patient request to schedule a Medicare Wellness/CPE prior to 03/29/16 per letter from [employer] [canceled Wellness on 02/22 & 03/06]; she has F/U scheduled for 03/29/16, cannot find 30 minute appt for patient, can you please check and call pt back on mobile phone at [phone]/SLS 04/03 Thanks.

## 2016-01-17 ENCOUNTER — Telehealth: Payer: Self-pay

## 2016-01-17 MED ORDER — ROSUVASTATIN CALCIUM 5 MG PO TABS: 5.0000 mg | ORAL_TABLET | Freq: Every day | ORAL | Status: DC

## 2016-01-17 MED ORDER — ROSUVASTATIN CALCIUM 5 MG PO TABS: 5.0000 mg | ORAL_TABLET | ORAL | Status: DC

## 2016-01-17 NOTE — Telephone Encounter (Signed)
Rx sent to pharmacy   

## 2016-01-17 NOTE — Telephone Encounter (Signed)
-----   Message from Donato SchultzYvonne R Lowne Chase, DO sent at 01/16/2016  5:14 PM EDT ----- Try crestor at 5 mg every other day--- call us with any problems

## 2016-02-20 ENCOUNTER — Telehealth: Payer: Self-pay | Admitting: Family Medicine

## 2016-02-20 ENCOUNTER — Other Ambulatory Visit: Payer: Self-pay | Admitting: Family Medicine

## 2016-02-20 DIAGNOSIS — Z1231 Encounter for screening mammogram for malignant neoplasm of breast: Secondary | ICD-10-CM

## 2016-02-20 NOTE — Telephone Encounter (Signed)
Call from patient and she stated the pharmacy keep filling her medication incorrectly. Glovance 5-500 2 tablets twice daily is what she should be taking but she keeps getting it once daily. I advised per previous notes the insurance company no longer covered it, she said they are still paying for it but it is much higher than before,. She wants to know how you want her to take it, is it one twice a day or 2 twice a day. Please advise     KP

## 2016-02-20 NOTE — Telephone Encounter (Signed)
i don't even see it on her med list---

## 2016-02-20 NOTE — Telephone Encounter (Signed)
I believe it is glucovance--- not glovance?  5/500---- 2 po bid

## 2016-02-20 NOTE — Telephone Encounter (Signed)
Jasmine DecemberSharon took it off, because it required a PA. You changed the medication but she never sent it in. Please advise        KP

## 2016-02-21 MED ORDER — GLYBURIDE-METFORMIN 5-500 MG PO TABS: 2.0000 | ORAL_TABLET | Freq: Two times a day (BID) | ORAL | Status: DC

## 2016-02-21 NOTE — Telephone Encounter (Signed)
The patient has been made aware and verbalized understanding.  Rx faxed.    KP 

## 2016-02-24 ENCOUNTER — Other Ambulatory Visit: Payer: Self-pay | Admitting: Family Medicine

## 2016-02-24 NOTE — Telephone Encounter (Signed)
Rx sent to the pharmacy by e-script.//AB/CMA 

## 2016-03-11 ENCOUNTER — Other Ambulatory Visit: Payer: Self-pay | Admitting: Family Medicine

## 2016-03-19 ENCOUNTER — Ambulatory Visit (HOSPITAL_BASED_OUTPATIENT_CLINIC_OR_DEPARTMENT_OTHER)
Admission: RE | Admit: 2016-03-19 | Discharge: 2016-03-19 | Disposition: A | Discharge status: Home / Self Care | Payer: Medicare Other | Source: Ambulatory Visit | Attending: Family Medicine | Admitting: Family Medicine

## 2016-03-19 DIAGNOSIS — Z1231 Encounter for screening mammogram for malignant neoplasm of breast: Secondary | ICD-10-CM

## 2016-03-29 ENCOUNTER — Ambulatory Visit (INDEPENDENT_AMBULATORY_CARE_PROVIDER_SITE_OTHER): Payer: Medicare Other | Admitting: Family Medicine

## 2016-03-29 ENCOUNTER — Encounter: Payer: Self-pay | Admitting: Family Medicine

## 2016-03-29 VITALS — BP 148/68 | HR 71 | Temp 97.5°F | Ht 64.0 in | Wt 135.8 lb

## 2016-03-29 DIAGNOSIS — K219 Gastro-esophageal reflux disease without esophagitis: Secondary | ICD-10-CM

## 2016-03-29 DIAGNOSIS — I10 Essential (primary) hypertension: Secondary | ICD-10-CM | POA: Diagnosis not present

## 2016-03-29 DIAGNOSIS — E785 Hyperlipidemia, unspecified: Secondary | ICD-10-CM

## 2016-03-29 DIAGNOSIS — E1151 Type 2 diabetes mellitus with diabetic peripheral angiopathy without gangrene: Secondary | ICD-10-CM

## 2016-03-29 DIAGNOSIS — R197 Diarrhea, unspecified: Secondary | ICD-10-CM | POA: Diagnosis not present

## 2016-03-29 LAB — LIPID PANEL
CHOLESTEROL: 276 mg/dL — AB (ref 0–200)
HDL: 52.3 mg/dL (ref >39.00)
LDL Cholesterol: 186 mg/dL — ABNORMAL HIGH (ref 0–99)
NonHDL: 223.45
Total CHOL/HDL Ratio: 5
Triglycerides: 186 mg/dL — ABNORMAL HIGH (ref 0.0–149.0)
VLDL: 37.2 mg/dL (ref 0.0–40.0)

## 2016-03-29 LAB — COMPREHENSIVE METABOLIC PANEL
ALBUMIN: 4.2 g/dL (ref 3.5–5.2)
ALK PHOS: 60 U/L (ref 39–117)
ALT: 13 U/L (ref 0–35)
AST: 20 U/L (ref 0–37)
BILIRUBIN TOTAL: 0.5 mg/dL (ref 0.2–1.2)
BUN: 24 mg/dL — AB (ref 6–23)
CO2: 27 mEq/L (ref 19–32)
CREATININE: 1.22 mg/dL — AB (ref 0.40–1.20)
Calcium: 9.6 mg/dL (ref 8.4–10.5)
Chloride: 102 mEq/L (ref 96–112)
GFR: 44.35 mL/min — ABNORMAL LOW (ref >60.00)
Glucose, Bld: 167 mg/dL — ABNORMAL HIGH (ref 70–99)
POTASSIUM: 4.2 meq/L (ref 3.5–5.1)
SODIUM: 137 meq/L (ref 135–145)
TOTAL PROTEIN: 7.8 g/dL (ref 6.0–8.3)

## 2016-03-29 LAB — TSH: TSH: 1.09 u[IU]/mL (ref 0.35–4.50)

## 2016-03-29 LAB — HEMOGLOBIN A1C: HEMOGLOBIN A1C: 5.8 % (ref 4.6–6.5)

## 2016-03-29 MED ORDER — ROSUVASTATIN CALCIUM 5 MG PO TABS: 5.0000 mg | ORAL_TABLET | ORAL | Status: DC

## 2016-03-29 MED ORDER — GLYBURIDE-METFORMIN 5-500 MG PO TABS: 1.0000 | ORAL_TABLET | Freq: Every day | ORAL | Status: DC

## 2016-03-29 MED ORDER — BENAZEPRIL HCL 20 MG PO TABS: 20.0000 mg | ORAL_TABLET | Freq: Every day | ORAL | Status: DC

## 2016-03-29 MED ORDER — PANTOPRAZOLE SODIUM 40 MG PO TBEC: 40.0000 mg | DELAYED_RELEASE_TABLET | Freq: Every day | ORAL | Status: DC

## 2016-03-29 MED ORDER — CARVEDILOL 12.5 MG PO TABS: ORAL_TABLET | ORAL | Status: DC

## 2016-03-29 MED ORDER — AMLODIPINE BESYLATE 10 MG PO TABS: 10.0000 mg | ORAL_TABLET | Freq: Every day | ORAL | Status: DC

## 2016-03-29 MED ORDER — FUROSEMIDE 40 MG PO TABS: ORAL_TABLET | ORAL | Status: DC

## 2016-03-29 NOTE — Progress Notes (Signed)
Patient ID: Brenda Spence, female    DOB: 1926/12/06  Age: 82 y.o. MRN: [id number]    Subjective:  Subjective HPI Brenda Spence presents for f/u DM and c/o diarrhea --- after eating -- no abd pain.    Review of Systems  Constitutional: Negative for diaphoresis, appetite change, fatigue and unexpected weight change.  Eyes: Negative for pain, redness and visual disturbance.  Respiratory: Negative for cough, chest tightness, shortness of breath and wheezing.   Cardiovascular: Negative for chest pain, palpitations and leg swelling.  Gastrointestinal: Positive for diarrhea. Negative for nausea, vomiting, abdominal pain, constipation, blood in stool, abdominal distention and anal bleeding.  Endocrine: Negative for cold intolerance, heat intolerance, polydipsia, polyphagia and polyuria.  Genitourinary: Negative for dysuria, frequency and difficulty urinating.  Neurological: Negative for dizziness, light-headedness, numbness and headaches.    History Past Medical History  Diagnosis Date  . Post-menopausal   . CAD (coronary artery disease) 2006    PCI of RCA in [place], [place] stent placed  . Broken ribs   . CHF (congestive heart failure) (HCC)     10/2013 Echo: EF 50% and grade 1 diastolic dysfx, mild MR  . Diabetes mellitus   . Hyperlipidemia   . Hypertension   . Arthritis   . Glaucoma   . Fatty liver 01/2013    seen on CT.   Marland Kitchen Dysphagia 10/2014    esophagram: dysmotility, narrowing at GEJ and likely esophageal ring there, small HH, delayed tablet passage.     She has past surgical history that includes Breast surgery; Appendectomy; Bladder surgery (1960); stent implant; Esophagogastroduodenoscopy (N/A, 10/21/2015); and maloney dilation (N/A, 10/21/2015).   Her family history includes Cancer in her sister; Coronary artery disease in her mother; Diabetes in her sister; Stroke in her brother.She reports that she has never smoked. She has never used smokeless tobacco. She reports that she does  not drink alcohol or use illicit drugs.  Current Outpatient Prescriptions on File Prior to Visit  Medication Sig Dispense Refill  . aspirin 81 MG tablet Take 81 mg by mouth daily.    . Calcium Carbonate-Vit D-Min (CALCIUM 1200) 1200-1000 MG-UNIT CHEW Chew 1 tablet by mouth daily. 30 each 2  . Cholecalciferol (VITAMIN D3) 3000 units TABS Take 3,000 Units by mouth daily.    Marland Kitchen glucose blood (ONETOUCH VERIO) test strip Check Blood sugar twice a day. Dx: E11.9 100 each prn  . latanoprost (XALATAN) 0.005 % ophthalmic solution Place 1 drop into both eyes at bedtime.     Letta Pate DELICA LANCETS FINE MISC Check blood sugar twice daily. Dx: E11.9 100 each prn   No current facility-administered medications on file prior to visit.     Objective:  Objective Physical Exam  Constitutional: She is oriented to person, place, and time. She appears well-developed and well-nourished.  HENT:  Head: Normocephalic and atraumatic.  Eyes: Conjunctivae and EOM are normal.  Neck: Normal range of motion. Neck supple. No JVD present. Carotid bruit is not present. No thyromegaly present.  Cardiovascular: Normal rate, regular rhythm and normal heart sounds.   No murmur heard. Pulmonary/Chest: Effort normal and breath sounds normal. No respiratory distress. She has no wheezes. She has no rales. She exhibits no tenderness.  Abdominal: Soft. Bowel sounds are normal. She exhibits no distension and no mass. There is no tenderness. There is no rebound and no guarding.  Musculoskeletal: She exhibits no edema.  Neurological: She is alert and oriented to person, place, and time.  Psychiatric: She  has a normal mood and affect.  Nursing note and vitals reviewed.  BP 148/68 mmHg  Pulse 71  Temp(Src) 97.5 F (36.4 C) (Oral)  Ht 5\' 4"  (1.626 m)  Wt 135 lb 12.8 oz (61.598 kg)  BMI 23.30 kg/m2  SpO2 97% Wt Readings from Last 3 Encounters:  03/29/16 135 lb 12.8 oz (61.598 kg)  10/21/15 137 lb 2 oz (62.2 kg)  09/29/15  140 lb (63.504 kg)     Lab Results  Component Value Date   WBC 5.5 10/20/2015   HGB 10.1* 10/20/2015   HCT 29.9* 10/20/2015   PLT 235 10/20/2015   GLUCOSE 167* 03/29/2016   CHOL 276* 03/29/2016   TRIG 186.0* 03/29/2016   HDL 52.30 03/29/2016   LDLDIRECT 194.0 09/29/2015   LDLCALC 186* 03/29/2016   ALT 13 03/29/2016   AST 20 03/29/2016   NA 137 03/29/2016   K 4.2 03/29/2016   CL 102 03/29/2016   CREATININE 1.22* 03/29/2016   BUN 24* 03/29/2016   CO2 27 03/29/2016   TSH 1.09 03/29/2016   HGBA1C 5.8 03/29/2016   MICROALBUR 14.4* 03/17/2015    Mm Digital Screening Bilateral  03/20/2016  CLINICAL DATA:  Screening. EXAM: DIGITAL SCREENING BILATERAL MAMMOGRAM WITH CAD COMPARISON:  Previous exam(s). ACR Breast Density Category b: There are scattered areas of fibroglandular density. FINDINGS: There are no findings suspicious for malignancy. Images were processed with CAD. IMPRESSION: No mammographic evidence of malignancy. A result letter of this screening mammogram will be mailed directly to the patient. RECOMMENDATION: Screening mammogram in one year. (Code:SM-B-01Y) BI-RADS CATEGORY  1: Negative. Electronically Signed   By: Hulan Saas M.D.   On: 03/20/2016 14:12     Assessment & Plan:  Plan I have discontinued Ms. Benningfield's glyBURIDE-metformin. I have also changed her amLODipine. Additionally, I am having her start on glyBURIDE-metformin. Lastly, I am having her maintain her aspirin, latanoprost, glucose blood, ONETOUCH DELICA LANCETS FINE, Vitamin D3, CALCIUM 1200, rosuvastatin, pantoprazole, furosemide, carvedilol, and benazepril.  Meds ordered this encounter  Medications  . glyBURIDE-metformin (GLUCOVANCE) 5-500 MG tablet    Sig: Take 1 tablet by mouth daily with breakfast.    Dispense:  60 tablet    Refill:  2  . rosuvastatin (CRESTOR) 5 MG tablet    Sig: Take 1 tablet (5 mg total) by mouth every other day.    Dispense:  90 tablet    Refill:  0  . pantoprazole  (PROTONIX) 40 MG tablet    Sig: Take 1 tablet (40 mg total) by mouth daily.    Dispense:  14 tablet    Refill:  0  . furosemide (LASIX) 40 MG tablet    Sig: Take 1/2 tablet daily    Dispense:  45 tablet    Refill:  1  . carvedilol (COREG) 12.5 MG tablet    Sig: Take 2 tablets in the morning and 2 tablets in the evening.    Dispense:  270 tablet    Refill:  0  . benazepril (LOTENSIN) 20 MG tablet    Sig: Take 1 tablet (20 mg total) by mouth daily.    Dispense:  90 tablet    Refill:  1  . amLODipine (NORVASC) 10 MG tablet    Sig: Take 1 tablet (10 mg total) by mouth daily.    Dispense:  90 tablet    Refill:  1    Problem List Items Addressed This Visit    Essential hypertension   Relevant Medications   rosuvastatin (  CRESTOR) 5 MG tablet   furosemide (LASIX) 40 MG tablet   carvedilol (COREG) 12.5 MG tablet   benazepril (LOTENSIN) 20 MG tablet   amLODipine (NORVASC) 10 MG tablet   Other Relevant Orders   Comprehensive metabolic panel (Completed)   Lipid panel (Completed)   Hemoglobin A1c (Completed)    Other Visit Diagnoses    DM (diabetes mellitus) type II controlled peripheral vascular disorder (HCC)    -  Primary    Relevant Medications    glyBURIDE-metformin (GLUCOVANCE) 5-500 MG tablet    rosuvastatin (CRESTOR) 5 MG tablet    furosemide (LASIX) 40 MG tablet    carvedilol (COREG) 12.5 MG tablet    benazepril (LOTENSIN) 20 MG tablet    amLODipine (NORVASC) 10 MG tablet    Other Relevant Orders    Comprehensive metabolic panel (Completed)    Lipid panel (Completed)    Hemoglobin A1c (Completed)    Hyperlipidemia LDL goal <70        Relevant Medications    rosuvastatin (CRESTOR) 5 MG tablet    furosemide (LASIX) 40 MG tablet    carvedilol (COREG) 12.5 MG tablet    benazepril (LOTENSIN) 20 MG tablet    amLODipine (NORVASC) 10 MG tablet    Other Relevant Orders    Comprehensive metabolic panel (Completed)    Lipid panel (Completed)    Hemoglobin A1c (Completed)      Diarrhea, unspecified type        Relevant Orders    Clostridium difficile EIA    TSH (Completed)    Gastroesophageal reflux disease, esophagitis presence not specified        Relevant Medications    pantoprazole (PROTONIX) 40 MG tablet       Follow-up: Return in about 6 months (around 09/28/2016), or if symptoms worsen or fail to improve, for hypertension, hyperlipidemia, diabetes II.  Donato SchultzYvonne R Lowne Chase, DO

## 2016-03-29 NOTE — Patient Instructions (Signed)

## 2016-03-29 NOTE — Progress Notes (Signed)
Pre visit review using our clinic review tool, if applicable. No additional management support is needed unless otherwise documented below in the visit note. 

## 2016-04-03 ENCOUNTER — Other Ambulatory Visit: Payer: Medicare Other

## 2016-04-03 ENCOUNTER — Other Ambulatory Visit: Payer: Self-pay | Admitting: Family Medicine

## 2016-04-04 ENCOUNTER — Encounter: Payer: Self-pay | Admitting: *Deleted

## 2016-04-04 LAB — C. DIFFICILE GDH AND TOXIN A/B
C. DIFF TOXIN A/B: NOT DETECTED
C. DIFFICILE GDH: NOT DETECTED

## 2016-04-11 ENCOUNTER — Other Ambulatory Visit: Payer: Self-pay

## 2016-04-11 DIAGNOSIS — E785 Hyperlipidemia, unspecified: Secondary | ICD-10-CM

## 2016-04-11 DIAGNOSIS — R7989 Other specified abnormal findings of blood chemistry: Secondary | ICD-10-CM

## 2016-04-11 MED ORDER — ROSUVASTATIN CALCIUM 5 MG PO TABS: 5.0000 mg | ORAL_TABLET | Freq: Every day | ORAL | Status: DC

## 2016-04-19 LAB — HM DIABETES EYE EXAM

## 2016-04-24 ENCOUNTER — Other Ambulatory Visit: Payer: Self-pay | Admitting: Family Medicine

## 2016-04-25 ENCOUNTER — Other Ambulatory Visit (INDEPENDENT_AMBULATORY_CARE_PROVIDER_SITE_OTHER): Payer: Medicare Other

## 2016-04-25 DIAGNOSIS — R748 Abnormal levels of other serum enzymes: Secondary | ICD-10-CM | POA: Diagnosis not present

## 2016-04-25 DIAGNOSIS — R7989 Other specified abnormal findings of blood chemistry: Secondary | ICD-10-CM

## 2016-04-25 LAB — BASIC METABOLIC PANEL
BUN: 21 mg/dL (ref 6–23)
CHLORIDE: 101 meq/L (ref 96–112)
CO2: 29 meq/L (ref 19–32)
CREATININE: 1.26 mg/dL — AB (ref 0.40–1.20)
Calcium: 9.4 mg/dL (ref 8.4–10.5)
GFR: 42.72 mL/min — ABNORMAL LOW (ref >60.00)
GLUCOSE: 238 mg/dL — AB (ref 70–99)
POTASSIUM: 4.5 meq/L (ref 3.5–5.1)
Sodium: 135 mEq/L (ref 135–145)

## 2016-04-27 ENCOUNTER — Other Ambulatory Visit: Payer: Self-pay

## 2016-04-27 MED ORDER — GLYBURIDE 5 MG PO TABS: 5.0000 mg | ORAL_TABLET | Freq: Every day | ORAL | Status: DC

## 2016-05-08 ENCOUNTER — Telehealth: Payer: Self-pay | Admitting: Family Medicine

## 2016-05-08 NOTE — Telephone Encounter (Signed)
Spoke with the patient and she stated that she is taking the glyburide once daily, she is concerned because her evening Blood sugars are over 200 and she wanted to know what to do. Please advise      KP.

## 2016-05-08 NOTE — Telephone Encounter (Signed)
She can try taking 1/2 bid instead of 1 in am I do not want to inc it to 1 bid because her hgba1c was so good

## 2016-05-08 NOTE — Telephone Encounter (Signed)
Pt called in to be advised on if she should get a refill on medication Glyburide? She says that she's not sure if PCP would like for her to continue med.

## 2016-05-09 ENCOUNTER — Encounter: Payer: Self-pay | Admitting: Family Medicine

## 2016-05-09 NOTE — Telephone Encounter (Signed)
Patient has been made aware and said she would try taking 1/2 twice daily and she will call and report her blood sugars.    KP

## 2016-05-14 ENCOUNTER — Other Ambulatory Visit: Payer: Self-pay | Admitting: Family Medicine

## 2016-05-21 ENCOUNTER — Telehealth: Payer: Self-pay | Admitting: Family Medicine

## 2016-05-21 MED ORDER — ONETOUCH DELICA LANCETS FINE MISC: 99 refills | Status: AC

## 2016-05-21 MED ORDER — GLUCOSE BLOOD VI STRP: ORAL_STRIP | 99 refills | Status: AC

## 2016-05-21 NOTE — Telephone Encounter (Signed)
°  Relationship to patient: Self  Can be reached: [phone]   Reason for call: Request that Rx for her diabetic supplies be faxed to [phone] [facility]

## 2016-05-21 NOTE — Telephone Encounter (Signed)
Rx faxed.    KP 

## 2016-06-08 ENCOUNTER — Telehealth: Payer: Self-pay | Admitting: Family Medicine

## 2016-06-08 MED ORDER — GLYBURIDE 5 MG PO TABS: 5.0000 mg | ORAL_TABLET | Freq: Two times a day (BID) | ORAL | 2 refills | Status: DC

## 2016-06-08 NOTE — Telephone Encounter (Signed)
Yes --- 1 ,  5 mg tablet daily

## 2016-06-08 NOTE — Telephone Encounter (Signed)
We cut it the last time because they were running low, please ok if she can increase the medication.     KP

## 2016-06-08 NOTE — Telephone Encounter (Signed)
Our med list says 1 tablet daily---- if she is taking 1/2 tablet bid-- she can inc to 1 tab bid

## 2016-06-08 NOTE — Telephone Encounter (Signed)
°  Relationship to patient: Self  Can be reached: [phone]   Reason for call: States she is taking one half pill twice a day of Glyburide and her BS is running above 200 every night and every morning. Wants to know if she can increase the medication. Plse adv

## 2016-06-08 NOTE — Telephone Encounter (Signed)
Wouldn't 1 tablet daily be the same dose as taking 1/2 tablet twice daily? Did you want her to increase dose further? Please advise.

## 2016-06-08 NOTE — Telephone Encounter (Signed)
Patient aware to take 1 twice a day and I have faxed the new script to the pharmacy,she will bring her glucometer in on Monday so we can do a comparison.   KP

## 2016-07-06 ENCOUNTER — Other Ambulatory Visit: Payer: Self-pay | Admitting: Family Medicine

## 2016-09-01 ENCOUNTER — Other Ambulatory Visit: Payer: Self-pay | Admitting: Family Medicine

## 2016-09-03 NOTE — Telephone Encounter (Signed)
Refill sent per [facility] refill protocol/SLS  

## 2016-09-20 ENCOUNTER — Telehealth: Payer: Self-pay | Admitting: Family Medicine

## 2016-09-20 NOTE — Telephone Encounter (Signed)
Patient scheduled for medicare wellness with Eber JonesCarolyn for 09/24/16 at 11:30am

## 2016-09-24 ENCOUNTER — Ambulatory Visit (INDEPENDENT_AMBULATORY_CARE_PROVIDER_SITE_OTHER): Payer: Medicare Other | Admitting: *Deleted

## 2016-09-24 ENCOUNTER — Encounter: Payer: Self-pay | Admitting: *Deleted

## 2016-09-24 VITALS — BP 160/74 | HR 58 | Resp 14 | Ht 62.0 in | Wt 133.4 lb

## 2016-09-24 DIAGNOSIS — I10 Essential (primary) hypertension: Secondary | ICD-10-CM

## 2016-09-24 DIAGNOSIS — Z0000 Encounter for general adult medical examination without abnormal findings: Secondary | ICD-10-CM

## 2016-09-24 DIAGNOSIS — L602 Onychogryphosis: Secondary | ICD-10-CM

## 2016-09-24 DIAGNOSIS — Z23 Encounter for immunization: Secondary | ICD-10-CM | POA: Diagnosis not present

## 2016-09-24 DIAGNOSIS — E083599 Diabetes mellitus due to underlying condition with proliferative diabetic retinopathy without macular edema, unspecified eye: Secondary | ICD-10-CM

## 2016-09-24 NOTE — Assessment & Plan Note (Signed)
Previously well-controlled, but pt reports CBGs at home running 200s "all the time." She is asymptomatic with these readings. No changes to diet/exercise. Changed from metformin + glucovance to glyburide in July by PCP. Fasting CBG today was 157. Due for labs, but pt is not fasting today. Office scheduled w/ PCP this week for assessment/ongoing management.  Lab Results  Component Value Date   HGBA1C 5.8 03/29/2016

## 2016-09-24 NOTE — Assessment & Plan Note (Signed)
BP elevated today. Pt is asymptomatic, reports her normal BP is systolic 140s-150s. Pt reports compliance w/ amlodipine, but is not sure if she is taking benazepril or not. She will bring all meds to next appt.  BP Readings from Last 3 Encounters:  09/24/16 (!) 160/74  03/29/16 (!) 148/68  10/21/15 (!) 169/69

## 2016-09-24 NOTE — Progress Notes (Signed)
Pre visit review using our clinic review tool, if applicable. No additional management support is needed unless otherwise documented below in the visit note. 

## 2016-09-24 NOTE — Progress Notes (Signed)
reviewed

## 2016-09-24 NOTE — Progress Notes (Signed)
Subjective:   Brenda Spence is a 82 y.o. female who presents for an Initial Medicare Annual Wellness Visit.  Review of Systems    No ROS.  Medicare Wellness Visit.  Cardiac Risk Factors include: diabetes mellitus;dyslipidemia;advanced age (>755men, 70>65 women);hypertension;sedentary lifestyle  Sleep patterns: has interrupted sleep, awakens early and is not rested upon awakening. Gets up every 2-3 to void since 'forever'.   Home Safety/Smoke Alarms: Feels safe in home. Smoke alarms in place.    Living environment; residence and Firearm Safety: Lives alone, no stairs in home. 1-story house/ trailer. No firearms. Seat Belt Safety/Bike Helmet: Wears seat belt.   Counseling:   Eye Exam- Sees Dr. Carlynn PurlPerez twice yearly Dental- No dentist. Dentures upper and lower.  Female:   Pap- N/A due to age      Mammo- last 03/19/16, BI-RADS CATEGORY  1: Negative.       Dexa scan- last 11/04/15, osteopenia. Calcium and vitamin D recommended, pt to start Fosamax if already on supplement. Recheck 2 years. CCS- Not on file. No routine screening due to age.     Objective:    Today's Vitals   09/24/16 1144  BP: (!) 152/74  Pulse: (!) 58  Resp: 14  SpO2: 98%  Weight: 133 lb 6.4 oz (60.5 kg)  Height: 5\' 2"  (1.575 m)   Body mass index is 24.4 kg/m.   Current Medications (verified) Outpatient Encounter Prescriptions as of 09/24/2016  Medication Sig  . amLODipine (NORVASC) 10 MG tablet Take 1 tablet (10 mg total) by mouth daily.  Marland Kitchen. aspirin 81 MG tablet Take 81 mg by mouth daily.  . Calcium Carbonate-Vit D-Min (CALCIUM 1200) 1200-1000 MG-UNIT CHEW Chew 1 tablet by mouth daily.  . carvedilol (COREG) 12.5 MG tablet TAKE 2 TABLETS IN THE MORNING AND 2 TABLETS IN THE EVENING.  . furosemide (LASIX) 40 MG tablet Take 1/2 tablet daily  . glucose blood (ONETOUCH VERIO) test strip Check Blood sugar twice a day. Dx: E11.9  . glyBURIDE (DIABETA) 5 MG tablet TAKE 1 TABLET (5 MG TOTAL) BY MOUTH 2 (TWO) TIMES DAILY  WITH A MEAL.  Marland Kitchen. latanoprost (XALATAN) 0.005 % ophthalmic solution Place 1 drop into both eyes at bedtime.   Letta Pate. ONETOUCH DELICA LANCETS FINE MISC Check blood sugar twice daily. Dx: E11.9  . rosuvastatin (CRESTOR) 5 MG tablet Take 1 tablet (5 mg total) by mouth at bedtime. (Patient taking differently: Take 5 mg by mouth every other day. )  . benazepril (LOTENSIN) 20 MG tablet Take 1 tablet (20 mg total) by mouth daily.  . pantoprazole (PROTONIX) 40 MG tablet Take 1 tablet (40 mg total) by mouth daily.  . [DISCONTINUED] Cholecalciferol (VITAMIN D3) 3000 units TABS Take 3,000 Units by mouth daily.   No facility-administered encounter medications on file as of 09/24/2016.     Allergies (verified) Patient has no known allergies.   History: Past Medical History:  Diagnosis Date  . Arthritis   . Broken ribs   . CAD (coronary artery disease) 2006   [place]  . CHF (congestive heart failure) (HCC)    10/2013 Echo: EF 50% and grade 1 diastolic dysfx, mild MR  . Diabetes mellitus   . Dysphagia 10/2014   esophagram: dysmotility, narrowing at GEJ and likely esophageal ring there, small HH, delayed tablet passage.   . Fatty liver 01/2013   seen on CT.   . Glaucoma   . Hyperlipidemia   . Hypertension   .  Post-menopausal    Past Surgical History:  Procedure Laterality Date  . APPENDECTOMY    . BLADDER SURGERY  1960  . BREAST SURGERY     fatty tissue  . ESOPHAGOGASTRODUODENOSCOPY N/A 10/21/2015   Procedure: ESOPHAGOGASTRODUODENOSCOPY (EGD);  Surgeon: Hilarie FredricksonJohn N Perry, MD;  [facility];  Service: Endoscopy;  Laterality: N/A;  . Elease HashimotoMALONEY DILATION N/A 10/21/2015   Procedure: Elease HashimotoMALONEY DILATION;  Surgeon: Hilarie FredricksonJohn N Perry, MD;  [facility];  Service: Endoscopy;  Laterality: N/A;  . stent implant     heart   Family History  Problem Relation Age of Onset  . Diabetes Sister   . Cancer Sister     stomach  . Stroke Brother   . Coronary artery disease Mother      Social History   Occupational History  . retired    Social History Main Topics  . Smoking status: Never Smoker  . Smokeless tobacco: Never Used  . Alcohol use No  . Drug use: No  . Sexual activity: No    Tobacco Counseling Counseling given: Not Answered   Activities of Daily Living In your present state of health, do you have any difficulty performing the following activities: 09/24/2016 10/19/2015  Hearing? N -  Vision? N -  Difficulty concentrating or making decisions? N -  Walking or climbing stairs? N -  Dressing or bathing? N -  Doing errands, shopping? N N  Preparing Food and eating ? N -  Using the Toilet? N -  In the past six months, have you accidently leaked urine? Y -  Do you have problems with loss of bowel control? N -  Managing your Medications? N -  Managing your Finances? N -  Housekeeping or managing your Housekeeping? N -  Some recent data might be hidden    Immunizations and Health Maintenance Immunization History  Administered Date(s) Administered  . Influenza Split 08/01/2012  . Influenza, High Dose Seasonal PF 09/29/2015  . Influenza,inj,Quad PF,36+ Mos 08/18/2013, 09/20/2014  . Pneumococcal Polysaccharide-23 10/14/2010   Health Maintenance Due  Topic Date Due  . INFLUENZA VACCINE  05/15/2016    Patient Care Team: Donato SchultzYvonne R Lowne Chase, DO as PCP - General (Family Medicine) Cephus RicherJoseph Perez [facility](Optometry)  Indicate any recent Medical Services you may have received from other than [facility] providers in the past year (date may be approximate).     Assessment:   This is a routine wellness examination for Brenda Spence. Physical assessment deferred to PCP.  Hearing/Vision screen Hearing Screening Comments: Able to hear conversational tones w/o difficulty. No issues reported.  Vision Screening Comments: Wears reading glasses. Dr. Carlynn PurlPerez twice yearly.  Dietary issues and exercise activities discussed: Current Exercise Habits: The patient does not  participate in regular exercise at present, Exercise limited by: None identified  Diet (meal preparation, eat out, water intake, caffeinated beverages, dairy products, fruits and vegetables): on average, 2 meals per day, high salt. Eats frozen [employer] dinners most of the time. Drinks plenty of water. Drinks coffee during the day. Eats bananas, does not eat much fruit because it increases blood sugar. Eats frozen vegetables.       Goals    . Start sewing again      Depression Screen PHQ 2/9 Scores 09/24/2016 09/29/2015 09/20/2014 08/18/2013 04/27/2013  PHQ - 2 Score 0 0 0 0 0    Fall Risk Fall Risk  09/24/2016 09/29/2015 09/20/2014 08/31/2013 08/18/2013  Falls in the past year? Yes No No Yes Yes  Number falls  in past yr: 1 - - - -  Injury with Fall? No - - - -  Risk for fall due to : - - - Impaired balance/gait Impaired mobility  Risk for fall due to (comments): - - - - pt fell on escalator    Cognitive Function: MMSE - Mini Mental State Exam 09/24/2016  Orientation to time 5  Orientation to Place 5  Registration 3  Attention/ Calculation 5  Recall 2  Language- name 2 objects 2  Language- repeat 1  Language- follow 3 step command 3  Language- read & follow direction 1  Write a sentence 1  Copy design 1  Total score 29        Screening Tests Health Maintenance  Topic Date Due  . INFLUENZA VACCINE  05/15/2016  . PNA vac Low Risk Adult (2 of 2 - PCV13) 03/29/2017 (Originally 10/15/2011)  . FOOT EXAM  09/28/2016  . HEMOGLOBIN A1C  09/28/2016  . OPHTHALMOLOGY EXAM  04/19/2017  . TETANUS/TDAP  08/19/2023  . DEXA SCAN  Completed  . ZOSTAVAX  Addressed      Plan:    Follow-up w/ Dr. Zola Button as scheduled. Bring a copy of your advance directives to your next office visit. Podiatry referral for thick, painful toe nails.  Bring medications to next visit to verify what she is taking. Flu shot given today. Pt declines Prevnar-13 at this time.  During the course of the  visit, Brenda Spence was educated and counseled about the following appropriate screening and preventive services:   Vaccines to include Pneumoccal, Influenza, Hepatitis B, Td, Zostavax, HCV  Cardiovascular disease screening  Colorectal cancer screening  Bone density screening  Diabetes screening  Glaucoma screening  Mammography/PAP  Nutrition counseling   Patient Instructions (the written plan) were given to the patient.    Starla Link, RN   09/24/2016

## 2016-09-24 NOTE — Patient Instructions (Addendum)
It was nice to meet you today.  You got your flu shot today. Follow-up w/ Dr. Zola ButtonLowne-Chase as scheduled. Bring a copy of your advance directives to your next office visit. Bring your medications to your next visit so we can verify them.

## 2016-09-28 ENCOUNTER — Ambulatory Visit (INDEPENDENT_AMBULATORY_CARE_PROVIDER_SITE_OTHER): Payer: Medicare Other | Admitting: Family Medicine

## 2016-09-28 ENCOUNTER — Encounter: Payer: Self-pay | Admitting: Family Medicine

## 2016-09-28 VITALS — BP 150/60 | HR 57 | Resp 16 | Ht 62.0 in | Wt 132.4 lb

## 2016-09-28 DIAGNOSIS — I10 Essential (primary) hypertension: Secondary | ICD-10-CM | POA: Diagnosis not present

## 2016-09-28 DIAGNOSIS — IMO0002 Reserved for concepts with insufficient information to code with codable children: Secondary | ICD-10-CM

## 2016-09-28 DIAGNOSIS — E1165 Type 2 diabetes mellitus with hyperglycemia: Secondary | ICD-10-CM | POA: Diagnosis not present

## 2016-09-28 DIAGNOSIS — R8299 Other abnormal findings in urine: Secondary | ICD-10-CM | POA: Diagnosis not present

## 2016-09-28 DIAGNOSIS — E1151 Type 2 diabetes mellitus with diabetic peripheral angiopathy without gangrene: Secondary | ICD-10-CM | POA: Diagnosis not present

## 2016-09-28 DIAGNOSIS — R82998 Other abnormal findings in urine: Secondary | ICD-10-CM

## 2016-09-28 DIAGNOSIS — E785 Hyperlipidemia, unspecified: Secondary | ICD-10-CM | POA: Diagnosis not present

## 2016-09-28 LAB — MICROALBUMIN / CREATININE URINE RATIO
Creatinine,U: 29.9 mg/dL
Microalb Creat Ratio: 92.2 mg/g — ABNORMAL HIGH (ref 0.0–30.0)
Microalb, Ur: 27.6 mg/dL — ABNORMAL HIGH (ref 0.0–1.9)

## 2016-09-28 LAB — COMPREHENSIVE METABOLIC PANEL
ALT: 12 U/L (ref 0–35)
AST: 18 U/L (ref 0–37)
Albumin: 4 g/dL (ref 3.5–5.2)
Alkaline Phosphatase: 54 U/L (ref 39–117)
BUN: 28 mg/dL — AB (ref 6–23)
CHLORIDE: 98 meq/L (ref 96–112)
CO2: 31 meq/L (ref 19–32)
CREATININE: 1.41 mg/dL — AB (ref 0.40–1.20)
Calcium: 10.2 mg/dL (ref 8.4–10.5)
GFR: 37.48 mL/min — ABNORMAL LOW (ref >60.00)
GLUCOSE: 208 mg/dL — AB (ref 70–99)
Potassium: 4.2 mEq/L (ref 3.5–5.1)
SODIUM: 136 meq/L (ref 135–145)
Total Bilirubin: 0.5 mg/dL (ref 0.2–1.2)
Total Protein: 7.5 g/dL (ref 6.0–8.3)

## 2016-09-28 LAB — POCT URINALYSIS DIPSTICK
BILIRUBIN UA: NEGATIVE
Blood, UA: NEGATIVE
Glucose, UA: NEGATIVE
KETONES UA: NEGATIVE
Nitrite, UA: NEGATIVE
PH UA: 6
SPEC GRAV UA: 1.015
Urobilinogen, UA: 0.2

## 2016-09-28 LAB — LIPID PANEL
CHOL/HDL RATIO: 4
Cholesterol: 207 mg/dL — ABNORMAL HIGH (ref 0–200)
HDL: 47.2 mg/dL (ref >39.00)
LDL CALC: 127 mg/dL — AB (ref 0–99)
NonHDL: 159.94
Triglycerides: 164 mg/dL — ABNORMAL HIGH (ref 0.0–149.0)
VLDL: 32.8 mg/dL (ref 0.0–40.0)

## 2016-09-28 LAB — HEMOGLOBIN A1C: HEMOGLOBIN A1C: 7 % — AB (ref 4.6–6.5)

## 2016-09-28 MED ORDER — SITAGLIPTIN PHOSPHATE 100 MG PO TABS: 100.0000 mg | ORAL_TABLET | Freq: Every day | ORAL | 2 refills | Status: DC

## 2016-09-28 NOTE — Progress Notes (Signed)
Pre visit review using our clinic review tool, if applicable. No additional management support is needed unless otherwise documented below in the visit note. 

## 2016-09-28 NOTE — Patient Instructions (Signed)
Carbohydrate Counting for Diabetes Mellitus, Adult Carbohydrate counting is a method for keeping track of how many carbohydrates you eat. Eating carbohydrates naturally increases the amount of sugar (glucose) in the blood. Counting how many carbohydrates you eat helps keep your blood glucose within normal limits, which helps you manage your diabetes (diabetes mellitus). It is important to know how many carbohydrates you can safely have in each meal. This is different for every person. A diet and nutrition specialist (registered dietitian) can help you make a meal plan and calculate how many carbohydrates you should have at each meal and snack. Carbohydrates are found in the following foods:  Grains, such as breads and cereals.  Dried beans and soy products.  Starchy vegetables, such as potatoes, peas, and corn.  Fruit and fruit juices.  Milk and yogurt.  Sweets and snack foods, such as cake, cookies, candy, chips, and soft drinks. How do I count carbohydrates? There are two ways to count carbohydrates in food. You can use either of the methods or a combination of both. Reading "Nutrition Facts" on packaged food  The "Nutrition Facts" list is included on the labels of almost all packaged foods and beverages in the [place] It includes:  The serving size.  Information about nutrients in each serving, including the grams (g) of carbohydrate per serving. To use the "Nutrition Facts":  Decide how many servings you will have.  Multiply the number of servings by the number of carbohydrates per serving.  The resulting number is the total amount of carbohydrates that you will be having. Learning standard serving sizes of other foods  When you eat foods containing carbohydrates that are not packaged or do not include "Nutrition Facts" on the label, you need to measure the servings in order to count the amount of carbohydrates:  Measure the foods that you will eat with a food scale or measuring  cup, if needed.  Decide how many standard-size servings you will eat.  Multiply the number of servings by 15. Most carbohydrate-rich foods have about 15 g of carbohydrates per serving.  For example, if you eat 8 oz (170 g) of strawberries, you will have eaten 2 servings and 30 g of carbohydrates (2 servings x 15 g = 30 g).  For foods that have more than one food mixed, such as soups and casseroles, you must count the carbohydrates in each food that is included. The following list contains standard serving sizes of common carbohydrate-rich foods. Each of these servings has about 15 g of carbohydrates:   hamburger bun or  English muffin.   oz (15 mL) syrup.   oz (14 g) jelly.  1 slice of bread.  1 six-inch tortilla.  3 oz (85 g) cooked rice or pasta.  4 oz (113 g) cooked dried beans.  4 oz (113 g) starchy vegetable, such as peas, corn, or potatoes.  4 oz (113 g) hot cereal.  4 oz (113 g) mashed potatoes or  of a large baked potato.  4 oz (113 g) canned or frozen fruit.  4 oz (120 mL) fruit juice.  4-6 crackers.  6 chicken nuggets.  6 oz (170 g) unsweetened dry cereal.  6 oz (170 g) plain fat-free yogurt or yogurt sweetened with artificial sweeteners.  8 oz (240 mL) milk.  8 oz (170 g) fresh fruit or one small piece of fruit.  24 oz (680 g) popped popcorn. Example of carbohydrate counting Sample meal  3 oz (85 g) chicken breast.  6 oz (  170 g) brown rice.  4 oz (113 g) corn.  8 oz (240 mL) milk.  8 oz (170 g) strawberries with sugar-free whipped topping. Carbohydrate calculation 1. Identify the foods that contain carbohydrates:  Rice.  Corn.  Milk.  Strawberries. 2. Calculate how many servings you have of each food:  2 servings rice.  1 serving corn.  1 serving milk.  1 serving strawberries. 3. Multiply each number of servings by 15 g:  2 servings rice x 15 g = 30 g.  1 serving corn x 15 g = 15 g.  1 serving milk x 15 g = 15  g.  1 serving strawberries x 15 g = 15 g. 4. Add together all of the amounts to find the total grams of carbohydrates eaten:  30 g + 15 g + 15 g + 15 g = 75 g of carbohydrates total. This information is not intended to replace advice given to you by your health care provider. Make sure you discuss any questions you have with your health care provider. Document Released: 10/01/2005 Document Revised: 04/20/2016 Document Reviewed: 03/14/2016 Elsevier Interactive Patient Education  2017 Elsevier Inc.  

## 2016-09-28 NOTE — Progress Notes (Signed)
Patient ID: Brenda Spence, female    DOB: 07-Feb-1927  Age: 82 y.o. MRN: [id number]    Subjective:  Subjective  HPI Brenda Spence presents for f/u bs running high in 200s -300s at home.  This started since taking glyburide only-- without metformin.  Metformin was stopped due to diarrhea.    Review of Systems  Constitutional: Negative for activity change, appetite change, fatigue and unexpected weight change.  Respiratory: Negative for cough and shortness of breath.   Cardiovascular: Negative for chest pain and palpitations.  Psychiatric/Behavioral: Negative for behavioral problems and dysphoric mood. The patient is not nervous/anxious.     History Past Medical History:  Diagnosis Date  . Arthritis   . Broken ribs   . CAD (coronary artery disease) 2006   [place]CA [place] stent [place]  . CHF (congestive heart failure) (HCC)    10/2013 Echo: EF 50% and grade 1 diastolic dysfx, mild MR  . Diabetes mellitus   . Dysphagia 10/2014   esophagram: dysmotility, narrowing at GEJ and likely esophageal ring there, small HH, delayed tablet passage.   . Fatty liver 01/2013   seen on CT.   . Glaucoma   . Hyperlipidemia   . Hypertension   . Post-menopausal     She has a past surgical history that includes Breast surgery; Appendectomy; Bladder surgery (1960); stent implant; Esophagogastroduodenoscopy (N/A, 10/21/2015); and maloney dilation (N/A, 10/21/2015).   Her family history includes Cancer in her sister; Coronary artery disease in her mother; Diabetes in her sister; Stroke in her brother.She reports that she has never smoked. She has never used smokeless tobacco. She reports that she does not drink alcohol or use drugs.  Current Outpatient Prescriptions on File Prior to Visit  Medication Sig Dispense Refill  . amLODipine (NORVASC) 10 MG tablet Take 1 tablet (10 mg total) by mouth daily. 90 tablet 1  . aspirin 81 MG tablet Take 81 mg by mouth daily.    .  benazepril (LOTENSIN) 20 MG tablet Take 1 tablet (20 mg total) by mouth daily. 90 tablet 1  . Calcium Carbonate-Vit D-Min (CALCIUM 1200) 1200-1000 MG-UNIT CHEW Chew 1 tablet by mouth daily. 30 each 2  . carvedilol (COREG) 12.5 MG tablet TAKE 2 TABLETS IN THE MORNING AND 2 TABLETS IN THE EVENING. 270 tablet 0  . furosemide (LASIX) 40 MG tablet Take 1/2 tablet daily 45 tablet 1  . glucose blood (ONETOUCH VERIO) test strip Check Blood sugar twice a day. Dx: E11.9 100 each prn  . glyBURIDE (DIABETA) 5 MG tablet TAKE 1 TABLET (5 MG TOTAL) BY MOUTH 2 (TWO) TIMES DAILY WITH A MEAL. 60 tablet 2  . latanoprost (XALATAN) 0.005 % ophthalmic solution Place 1 drop into both eyes at bedtime.     Letta Pate. ONETOUCH DELICA LANCETS FINE MISC Check blood sugar twice daily. Dx: E11.9 100 each prn  . rosuvastatin (CRESTOR) 5 MG tablet Take 1 tablet (5 mg total) by mouth at bedtime. (Patient taking differently: Take 5 mg by mouth every other day. ) 90 tablet 0  . pantoprazole (PROTONIX) 40 MG tablet Take 1 tablet (40 mg total) by mouth daily. (Patient not taking: Reported on 09/28/2016) 14 tablet 0   No current facility-administered medications on file prior to visit.      Objective:  Objective  Physical Exam  Constitutional: She is oriented to person, place, and time. She appears well-developed and well-nourished.  HENT:  Head: Normocephalic and atraumatic.  Eyes: Conjunctivae and EOM are normal.  Neck: Normal range of motion. Neck supple. No JVD present. Carotid bruit is not present. No thyromegaly present.  Cardiovascular: Normal rate, regular rhythm and normal heart sounds.   No murmur heard. Pulmonary/Chest: Effort normal and breath sounds normal. No respiratory distress. She has no wheezes. She has no rales. She exhibits no tenderness.  Musculoskeletal: She exhibits no edema.  Neurological: She is alert and oriented to person, place, and time.  Psychiatric: She has a normal mood and affect.  Nursing note and  vitals reviewed.  BP (!) 150/60 (BP Location: Left Arm, Patient Position: Sitting, Cuff Size: Normal)   Pulse (!) 57   Resp 16   Ht 5\' 2"  (1.575 m)   Wt 132 lb 6.4 oz (60.1 kg)   SpO2 98%   BMI 24.22 kg/m  Wt Readings from Last 3 Encounters:  09/28/16 132 lb 6.4 oz (60.1 kg)  09/24/16 133 lb 6.4 oz (60.5 kg)  03/29/16 135 lb 12.8 oz (61.6 kg)     Lab Results  Component Value Date   WBC 5.5 10/20/2015   HGB 10.1 (L) 10/20/2015   HCT 29.9 (L) 10/20/2015   PLT 235 10/20/2015   GLUCOSE 208 (H) 09/28/2016   CHOL 207 (H) 09/28/2016   TRIG 164.0 (H) 09/28/2016   HDL 47.20 09/28/2016   LDLDIRECT 194.0 09/29/2015   LDLCALC 127 (H) 09/28/2016   ALT 12 09/28/2016   AST 18 09/28/2016   NA 136 09/28/2016   K 4.2 09/28/2016   CL 98 09/28/2016   CREATININE 1.41 (H) 09/28/2016   BUN 28 (H) 09/28/2016   CO2 31 09/28/2016   TSH 1.09 03/29/2016   HGBA1C 7.0 (H) 09/28/2016   MICROALBUR 27.6 (H) 09/28/2016    Mm Digital Screening Bilateral  Result Date: 03/20/2016 CLINICAL DATA:  Screening. EXAM: DIGITAL SCREENING BILATERAL MAMMOGRAM WITH CAD COMPARISON:  Previous exam(s). ACR Breast Density Category b: There are scattered areas of fibroglandular density. FINDINGS: There are no findings suspicious for malignancy. Images were processed with CAD. IMPRESSION: No mammographic evidence of malignancy. A result letter of this screening mammogram will be mailed directly to the patient. RECOMMENDATION: Screening mammogram in one year. (Code:SM-B-01Y) BI-RADS CATEGORY  1: Negative. Electronically Signed   By: Hulan Saas M.D.   On: 03/20/2016 14:12     Assessment & Plan:  Plan  I am having Brenda Spence start on sitaGLIPtin. I am also having her maintain her aspirin, latanoprost, CALCIUM 1200, pantoprazole, furosemide, benazepril, amLODipine, rosuvastatin, glucose blood, ONETOUCH DELICA LANCETS FINE, glyBURIDE, and carvedilol.  Meds ordered this encounter  Medications  . sitaGLIPtin (JANUVIA)  100 MG tablet    Sig: Take 1 tablet (100 mg total) by mouth daily.    Dispense:  100 tablet    Refill:  2    Problem List Items Addressed This Visit      Unprioritized   Essential hypertension   Relevant Orders   Comprehensive metabolic panel (Completed)   Lipid panel (Completed)   Hemoglobin A1c (Completed)   POCT urinalysis dipstick (Completed)   Microalbumin / creatinine urine ratio (Completed)    Other Visit Diagnoses    DM (diabetes mellitus) type II uncontrolled, periph vascular disorder (HCC)    -  Primary   Relevant Medications   sitaGLIPtin (JANUVIA) 100 MG tablet   Other Relevant Orders   Comprehensive metabolic panel (Completed)   Lipid panel (Completed)   Hemoglobin A1c (Completed)  POCT urinalysis dipstick (Completed)   Microalbumin / creatinine urine ratio (Completed)   Hyperlipidemia LDL goal <70       Relevant Orders   Comprehensive metabolic panel (Completed)   Lipid panel (Completed)   Hemoglobin A1c (Completed)   POCT urinalysis dipstick (Completed)   Microalbumin / creatinine urine ratio (Completed)   Leukocytes in urine       Relevant Orders   Urine culture (Completed)      Follow-up: Return in about 3 months (around 12/27/2016) for hypertension, hyperlipidemia, diabetes II.  Donato SchultzYvonne R Lowne Chase, DO

## 2016-09-30 ENCOUNTER — Encounter: Payer: Self-pay | Admitting: Family Medicine

## 2016-09-30 LAB — URINE CULTURE

## 2016-09-30 MED ORDER — SITAGLIPTIN PHOSPHATE 100 MG PO TABS: 100.0000 mg | ORAL_TABLET | Freq: Every day | ORAL | 2 refills | Status: DC

## 2016-09-30 MED ORDER — AMLODIPINE BESYLATE 10 MG PO TABS: 10.0000 mg | ORAL_TABLET | Freq: Every day | ORAL | 1 refills | Status: AC

## 2016-09-30 MED ORDER — ROSUVASTATIN CALCIUM 5 MG PO TABS: 5.0000 mg | ORAL_TABLET | Freq: Every day | ORAL | 0 refills | Status: AC

## 2016-09-30 MED ORDER — BENAZEPRIL HCL 20 MG PO TABS: 20.0000 mg | ORAL_TABLET | Freq: Every day | ORAL | 1 refills | Status: DC

## 2016-09-30 MED ORDER — FUROSEMIDE 40 MG PO TABS: ORAL_TABLET | ORAL | 1 refills | Status: DC

## 2016-09-30 MED ORDER — GLYBURIDE 5 MG PO TABS: 5.0000 mg | ORAL_TABLET | Freq: Two times a day (BID) | ORAL | 2 refills | Status: DC

## 2016-09-30 MED ORDER — CARVEDILOL 12.5 MG PO TABS: ORAL_TABLET | ORAL | 0 refills | Status: DC

## 2016-10-01 ENCOUNTER — Telehealth: Payer: Self-pay | Admitting: Family Medicine

## 2016-10-01 DIAGNOSIS — E1122 Type 2 diabetes mellitus with diabetic chronic kidney disease: Secondary | ICD-10-CM

## 2016-10-01 NOTE — Telephone Encounter (Signed)
Caller name: Relationship to patient: Self Can be reached: [phone]  Pharmacy:  Reason for call: States that provider informed her to call if the sitaGLIPtin (JANUVIA) 100 MG tablet  Was too expensive. States she can not afford it and needs another medication.

## 2016-10-02 ENCOUNTER — Telehealth: Payer: Self-pay

## 2016-10-02 ENCOUNTER — Encounter: Payer: Self-pay | Admitting: Podiatry

## 2016-10-02 ENCOUNTER — Ambulatory Visit (INDEPENDENT_AMBULATORY_CARE_PROVIDER_SITE_OTHER): Payer: Medicare Other | Admitting: Podiatry

## 2016-10-02 VITALS — BP 182/68 | HR 74 | Resp 18

## 2016-10-02 DIAGNOSIS — B351 Tinea unguium: Secondary | ICD-10-CM | POA: Diagnosis not present

## 2016-10-02 DIAGNOSIS — M79675 Pain in left toe(s): Secondary | ICD-10-CM | POA: Diagnosis not present

## 2016-10-02 DIAGNOSIS — M79674 Pain in right toe(s): Secondary | ICD-10-CM | POA: Diagnosis not present

## 2016-10-02 DIAGNOSIS — L600 Ingrowing nail: Secondary | ICD-10-CM

## 2016-10-02 DIAGNOSIS — T83510A Infection and inflammatory reaction due to cystostomy catheter, initial encounter: Secondary | ICD-10-CM

## 2016-10-02 DIAGNOSIS — N390 Urinary tract infection, site not specified: Secondary | ICD-10-CM

## 2016-10-02 DIAGNOSIS — N10 Acute tubulo-interstitial nephritis: Secondary | ICD-10-CM

## 2016-10-02 MED ORDER — CIPROFLOXACIN HCL 250 MG PO TABS: 250.0000 mg | ORAL_TABLET | Freq: Two times a day (BID) | ORAL | 0 refills | Status: DC

## 2016-10-02 NOTE — Telephone Encounter (Signed)
Please advise. LB 

## 2016-10-02 NOTE — Progress Notes (Signed)
   Subjective:    Patient ID: Brenda Spence, female    DOB: October 27, 1926, 82 y.o.   MRN: [id number]  HPI   I am here to get my feet and toenails checked and I am a diabetic and I have some toenails that are curling on the big toes and my feet get sore and tender and hurts with shoes    Review of Systems  All other systems reviewed and are negative.      Objective:   Physical Exam        Assessment & Plan:

## 2016-10-02 NOTE — Telephone Encounter (Signed)
Rx sent to pharmacy, per providers request. LB

## 2016-10-02 NOTE — Progress Notes (Signed)
Subjective:     Patient ID: Brenda Spence, female   DOB: Feb 19, 1927, 82 y.o.   MRN: [id number]  HPI 82 year old female presents to the office today for concerns of thick, painful, elongated toenails that she cannot trim herself. She states the nails are painful with pressure and shoegear. No redness or drainage from the nails. The nails are curling in. No other complaints today.   Last A1c is 7  Review of Systems  All other systems reviewed and are negative.      Objective:   Physical Exam General: AAO x3, NAD  Dermatological: Nails are hypertrophic, dystrophic, brittle, discolored, elongated 10. No surrounding redness or drainage. Tenderness nails 1-5 bilaterally. No open lesions or pre-ulcerative lesions are identified today. There is mild incurvation of the bilateral hallux toenails. No signs of infection.   Vascular: Dorsalis Pedis artery and Posterior Tibial artery pedal pulses are 2/4 bilateral with immedate capillary fill time. There is no pain with calf compression, swelling, warmth, erythema.   Neruologic: Grossly intact via light touch bilateral. Vibratory intact via tuning fork bilateral. Protective threshold with Semmes Wienstein monofilament intact to all pedal sites bilateral.  Musculoskeletal: No gross boney pedal deformities bilateral. No pain, crepitus, or limitation noted with foot and ankle range of motion bilateral. Muscular strength 5/5 in all groups tested bilateral.  Gait: Unassisted, Nonantalgic.      Assessment:     82-year-female with symptomatic onychomycosis     Plan:     -Treatment options discussed including all alternatives, risks, and complications -Etiology of symptoms were discussed -Nails debrided 10 without complications or bleeding. -Discussed treatment options for her toenails- she would like to start with topical treamtment OTC. If she desires we can order nail lacqer through [facility].  -Daily foot inspection -Follow-up in 3  months or sooner if any problems arise. In the meantime, encouraged to call the office with any questions, concerns, change in symptoms.   Ovid CurdMatthew Wagoner, DPM

## 2016-10-02 NOTE — Telephone Encounter (Signed)
-----   Message from Brenda SchultzYvonne R Lowne Chase, DO sent at 10/01/2016  8:46 PM EST ----- + UTI---  cipro 250 mg bid x 3 days

## 2016-10-02 NOTE — Telephone Encounter (Signed)
D/c januvia, continue glyburide.  I will refer to endo.

## 2016-10-03 NOTE — Telephone Encounter (Signed)
Updated list and patient informed of referral.

## 2016-10-05 ENCOUNTER — Other Ambulatory Visit: Payer: Self-pay | Admitting: *Deleted

## 2016-10-05 MED ORDER — GLYBURIDE 5 MG PO TABS: 5.0000 mg | ORAL_TABLET | Freq: Two times a day (BID) | ORAL | 0 refills | Status: DC

## 2016-10-12 ENCOUNTER — Other Ambulatory Visit: Payer: Self-pay

## 2016-10-30 ENCOUNTER — Other Ambulatory Visit: Payer: Self-pay | Admitting: Family Medicine

## 2016-11-02 ENCOUNTER — Ambulatory Visit: Payer: Medicare Other | Admitting: Family Medicine

## 2016-11-06 ENCOUNTER — Ambulatory Visit (INDEPENDENT_AMBULATORY_CARE_PROVIDER_SITE_OTHER): Payer: Medicare Other | Admitting: Internal Medicine

## 2016-11-06 ENCOUNTER — Other Ambulatory Visit: Payer: Self-pay | Admitting: Family Medicine

## 2016-11-06 VITALS — BP 130/74 | HR 73 | Ht 62.0 in | Wt 130.0 lb

## 2016-11-06 DIAGNOSIS — E083599 Diabetes mellitus due to underlying condition with proliferative diabetic retinopathy without macular edema, unspecified eye: Secondary | ICD-10-CM | POA: Diagnosis not present

## 2016-11-06 DIAGNOSIS — I10 Essential (primary) hypertension: Secondary | ICD-10-CM

## 2016-11-06 MED ORDER — METFORMIN HCL ER 500 MG PO TB24: 500.0000 mg | ORAL_TABLET | Freq: Every day | ORAL | 3 refills | Status: DC

## 2016-11-06 MED ORDER — GLIPIZIDE 5 MG PO TABS: 5.0000 mg | ORAL_TABLET | Freq: Two times a day (BID) | ORAL | 3 refills | Status: DC

## 2016-11-06 NOTE — Progress Notes (Signed)
Patient ID: Brenda Spence, female   DOB: 05/25/1927, 83 y.o.   MRN: [id number]   HPI: Brenda Spence is a 83 y.o.-year-old female, referred by her PCP, Dr. Laury Axon, for management of DM2, dx in 1995 non-insulin-dependent, controlled, with complications (CKD stage 2-3, CAD, s/p PCA, CHF, DR). She is here with her daughter who offers part of the history.  Last hemoglobin A1c was: Lab Results  Component Value Date   HGBA1C 7.0 (H) 09/28/2016   HGBA1C 5.8 03/29/2016   HGBA1C 6.3 01/10/2016   Pt is on a regimen of: - Glyburide 5 mg 2x a day, with food She had to stop Metformin 2/2 diarrhea. She could not afford Januvia. She was on Aloe Cran supplement. Stopped 2 mo ago.   Pt checks her sugars 2x a day and they are: - am: 120, 129-179, 209 - 2h after b'fast: n/c - before lunch: n/c - 2h after lunch: n/c - before dinner: n/c - 2h after dinner: n/c - bedtime: 150-390, 413 - nighttime: n/c No lows. Lowest sugar was 112;? she has hypoglycemia awareness. Highest sugar was 413.  Glucometer: Prodigy  Pt's meals are: - Breakfast: Cereals, coffee - Lunch: Soup - Dinner: Soup, spaghetti - Snacks: Twice daily: Pretzels, popcorn  - + CKD, last BUN/creatinine:  Lab Results  Component Value Date   BUN 28 (H) 09/28/2016   BUN 21 04/25/2016   CREATININE 1.41 (H) 09/28/2016   CREATININE 1.26 (H) 04/25/2016   - last set of lipids: Lab Results  Component Value Date   CHOL 207 (H) 09/28/2016   HDL 47.20 09/28/2016   LDLCALC 127 (H) 09/28/2016   LDLDIRECT 194.0 09/29/2015   TRIG 164.0 (H) 09/28/2016   CHOLHDL 4 09/28/2016  On Crestor. - last eye exam was in 10/2016. + DR.  - no numbness and tingling in her feet. Started to see a podiatrist Dr. Ardelle Anton)   Pt has FH of DM in P aunt.  She also has a history of HTN, HL.  ROS: Constitutional: no weight gain/loss, no fatigue, no subjective hyperthermia/hypothermia, + Poor sleep, + excessive urination and nocturia Eyes: + blurry vision, no  xerophthalmia ENT: no sore throat, no nodules palpated in throat, no dysphagia/odynophagia, no hoarseness Cardiovascular: no CP/SOB/palpitations/+ leg swelling Respiratory: no cough/SOB Gastrointestinal: no N/V/D/C Musculoskeletal: no muscle/joint aches Skin: no rashes, + hair loss, + easy bruising Neurological: no tremors/numbness/tingling/dizziness Psychiatric: no depression/anxiety  Past Medical History:  Diagnosis Date  . Arthritis   . Broken ribs   . CAD (coronary artery disease) 2006   PCI of RCA in [place], [place] stent placed  . CHF (congestive heart failure) (HCC)    10/2013 Echo: EF 50% and grade 1 diastolic dysfx, mild MR  . Diabetes mellitus   . Dysphagia 10/2014   esophagram: dysmotility, narrowing at GEJ and likely esophageal ring there, small HH, delayed tablet passage.   . Fatty liver 01/2013   seen on CT.   . Glaucoma   . Hyperlipidemia   . Hypertension   . Post-menopausal    Past Surgical History:  Procedure Laterality Date  . APPENDECTOMY    . BLADDER SURGERY  1960  . BREAST SURGERY     fatty tissue  . ESOPHAGOGASTRODUODENOSCOPY N/A 10/21/2015   Procedure: ESOPHAGOGASTRODUODENOSCOPY (EGD);  Surgeon: Hilarie Fredrickson, MD;  Location: [facility] ENDOSCOPY;  Service: Endoscopy;  Laterality: N/A;  . [name] DILATION N/A 10/21/2015   Procedure: [name] DILATION;  Surgeon: Hilarie Fredrickson, MD;  Location: [facility] ENDOSCOPY;  Service:  Endoscopy;  Laterality: N/A;  . stent implant     heart   Social History   Social History  . Marital status: Widowed    Spouse name: N/A  . Number of children: 4   Occupational History  . retired    Social History Main Topics  . Smoking status: Never Smoker  . Smokeless tobacco: Never Used  . Alcohol use No  . Drug use: No   Current Outpatient Prescriptions on File Prior to Visit  Medication Sig Dispense Refill  . amLODipine (NORVASC) 10 MG tablet Take 1 tablet (10 mg total) by mouth daily. 90 tablet 1  . aspirin 81 MG tablet Take 81 mg by mouth  daily.    . Calcium Carbonate-Vit D-Min (CALCIUM 1200) 1200-1000 MG-UNIT CHEW Chew 1 tablet by mouth daily. 30 each 2  . carvedilol (COREG) 12.5 MG tablet TAKE 2 TABLETS IN THE MORNING AND 2 TABLETS IN THE EVENING. 270 tablet 0  . ciprofloxacin (CIPRO) 250 MG tablet Take 1 tablet (250 mg total) by mouth 2 (two) times daily. 6 tablet 0  . furosemide (LASIX) 40 MG tablet Take 1/2 tablet daily 45 tablet 1  . glucose blood (ONETOUCH VERIO) test strip Check Blood sugar twice a day. Dx: E11.9 100 each prn  . glyBURIDE (DIABETA) 5 MG tablet Take 1 tablet (5 mg total) by mouth 2 (two) times daily with a meal. 90 tablet 0  . latanoprost (XALATAN) 0.005 % ophthalmic solution Place 1 drop into both eyes at bedtime.     Letta Pate DELICA LANCETS FINE MISC Check blood sugar twice daily. Dx: E11.9 100 each prn  . rosuvastatin (CRESTOR) 5 MG tablet Take 1 tablet (5 mg total) by mouth at bedtime. 90 tablet 0  . pantoprazole (PROTONIX) 40 MG tablet Take 1 tablet (40 mg total) by mouth daily. (Patient not taking: Reported on 09/28/2016) 14 tablet 0   No current facility-administered medications on file prior to visit.    No Known Allergies Family History  Problem Relation Age of Onset  . Diabetes Sister   . Cancer Sister     stomach  . Stroke Brother   . Coronary artery disease Mother     PE: BP 130/74 (BP Location: Left Arm, Patient Position: Sitting)   Pulse 73   Ht 5\' 2"  (1.575 m)   Wt 130 lb (59 kg)   SpO2 98%   BMI 23.78 kg/m  Wt Readings from Last 3 Encounters:  11/06/16 130 lb (59 kg)  09/28/16 132 lb 6.4 oz (60.1 kg)  09/24/16 133 lb 6.4 oz (60.5 kg)   Constitutional: overweight, in NAD Eyes: PERRLA, EOMI, no exophthalmos ENT: moist mucous membranes, no thyromegaly, no cervical lymphadenopathy Cardiovascular: RRR, No MRG Respiratory: CTA B Gastrointestinal: abdomen soft, NT, ND, BS+ Musculoskeletal: no deformities, strength intact in all 4 Skin: moist, warm, no rashes Neurological:  no tremor with outstretched hands, DTR normal in all 4  ASSESSMENT: 1. DM2, non-insulin-dependent, controlled, with complications - CKD stage 2-3 - CAD, s/p PCA - CHF  PLAN:  1. Patient with long-standing, uncontrolled diabetes, on oral antidiabetic regimen, which became insufficient.Her HbA1c is at goal for her, however, she has hypoglycemia especially at bedtime, which then carries over to morning sugars. I suggested to start a low-dose metformin ER (she had diarrhea with a regular metformin) to hopefully improve the sugars in the morning. Instead of glyburide, which can impair cardiac ischemic adaptability, I suggested glipizide. I also advised her to move the medication  to 10 minutes before meals. I will see her back in a month and a half and see how these changes helped her sugars. We may need to add a third drug, however, we are limited by cost. - I suggested to:  Patient Instructions  Please stop Glyburide and start Glipizide 5 mg 10 min before b'fast and dinner.  Start Metformin ER 500 mg with dinner.  Please let me know if the sugars are consistently <80 or >200.  Please return in 1.5 months with your sugar log.   - Strongly advised her to start checking sugars at different times of the day - check 1-2 times a day, rotating checks - given sugar log and advised how to fill it and to bring it at next appt  - given foot care handout and explained the principles  - given instructions for hypoglycemia management "15-15 rule"  - advised for yearly eye exams >> she is up-to-date - Return to clinic in 1.5 mo with sugar log   Carlus Pavlovristina Jaydence Vanyo, MD PhD [facility]

## 2016-11-06 NOTE — Patient Instructions (Signed)
Please stop Glyburide and start Glipizide 5 mg 10 min before b'fast and dinner.  Start Metformin ER 500 mg with dinner.  Please let me know if the sugars are consistently <80 or >200.  Please return in 1.5 months with your sugar log.   PATIENT INSTRUCTIONS FOR TYPE 2 DIABETES:  **Please join MyChart!** - see attached instructions about how to join if you have not done so already.  DIET AND EXERCISE Diet and exercise is an important part of diabetic treatment.  We recommended aerobic exercise in the form of brisk walking (working between 40-60% of maximal aerobic capacity, similar to brisk walking) for 150 minutes per week (such as 30 minutes five days per week) along with 3 times per week performing 'resistance' training (using various gauge rubber tubes with handles) 5-10 exercises involving the major muscle groups (upper body, lower body and core) performing 10-15 repetitions (or near fatigue) each exercise. Start at half the above goal but build slowly to reach the above goals. If limited by weight, joint pain, or disability, we recommend daily walking in a swimming pool with water up to waist to reduce pressure from joints while allow for adequate exercise.    BLOOD GLUCOSES Monitoring your blood glucoses is important for continued management of your diabetes. Please check your blood glucoses 2-4 times a day: fasting, before meals and at bedtime (you can rotate these measurements - e.g. one day check before the 3 meals, the next day check before 2 of the meals and before bedtime, etc.).   HYPOGLYCEMIA (low blood sugar) Hypoglycemia is usually a reaction to not eating, exercising, or taking too much insulin/ other diabetes drugs.  Symptoms include tremors, sweating, hunger, confusion, headache, etc. Treat IMMEDIATELY with 15 grams of Carbs: . 4 glucose tablets .  cup regular juice/soda . 2 tablespoons raisins . 4 teaspoons sugar . 1 tablespoon honey Recheck blood glucose in 15 mins  and repeat above if still symptomatic/blood glucose <100.  RECOMMENDATIONS TO REDUCE YOUR RISK OF DIABETIC COMPLICATIONS: * Take your prescribed MEDICATION(S) * Follow a DIABETIC diet: Complex carbs, fiber rich foods, (monounsaturated and polyunsaturated) fats * AVOID saturated/trans fats, high fat foods, >2,300 mg salt per day. * EXERCISE at least 5 times a week for 30 minutes or preferably daily.  * DO NOT SMOKE OR DRINK more than 1 drink a day. * Check your FEET every day. Do not wear tightfitting shoes. Contact us if you develop an ulcer * See your EYE doctor once a year or more if needed * Get a FLU shot once a year * Get a PNEUMONIA vaccine once before and once after age 57 years  GOALS:  * Your Hemoglobin A1c of <7%  * fasting sugars need to be <130 * after meals sugars need to be <180 (2h after you start eating) * Your Systolic BP should be 140 or lower  * Your Diastolic BP should be 80 or lower  * Your HDL (Good Cholesterol) should be 40 or higher  * Your LDL (Bad Cholesterol) should be 100 or lower. * Your Triglycerides should be 150 or lower  * Your Urine microalbumin (kidney function) should be <30 * Your Body Mass Index should be 25 or lower    Please consider the following ways to cut down carbs and fat and increase fiber and micronutrients in your diet: - substitute whole grain for white bread or pasta - substitute brown rice for white rice - substitute 90-calorie flat bread pieces for slices  of bread when possible - substitute sweet potatoes or yams for white potatoes - substitute humus for margarine - substitute tofu for cheese when possible - substitute almond or rice milk for regular milk (would not drink soy milk daily due to concern for soy estrogen influence on breast cancer risk) - substitute dark chocolate for other sweets when possible - substitute water - can add lemon or orange slices for taste - for diet sodas (artificial sweeteners will trick your body  that you can eat sweets without getting calories and will lead you to overeating and weight gain in the long run) - do not skip breakfast or other meals (this will slow down the metabolism and will result in more weight gain over time)  - can try smoothies made from fruit and almond/rice milk in am instead of regular breakfast - can also try old-fashioned (not instant) oatmeal made with almond/rice milk in am - order the dressing on the side when eating salad at a restaurant (pour less than half of the dressing on the salad) - eat as little meat as possible - can try juicing, but should not forget that juicing will get rid of the fiber, so would alternate with eating raw veg./fruits or drinking smoothies - use as little oil as possible, even when using olive oil - can dress a salad with a mix of balsamic vinegar and lemon juice, for e.g. - use agave nectar, stevia sugar, or regular sugar rather than artificial sweateners - steam or broil/roast veggies  - snack on veggies/fruit/nuts (unsalted, preferably) when possible, rather than processed foods - reduce or eliminate aspartame in diet (it is in diet sodas, chewing gum, etc) Read the labels!  Try to read Dr. Katherina RightNeal Barnard's book: "Program for Reversing Diabetes" for other ideas for healthy eating.

## 2016-11-07 ENCOUNTER — Encounter: Payer: Self-pay | Admitting: Internal Medicine

## 2016-11-09 ENCOUNTER — Telehealth: Payer: Self-pay | Admitting: Internal Medicine

## 2016-11-09 NOTE — Telephone Encounter (Addendum)
Patient has question about when to take medication Metformin and the glipizide. Please advise

## 2016-11-12 ENCOUNTER — Telehealth: Payer: Self-pay

## 2016-11-12 NOTE — Telephone Encounter (Signed)
Called and left message advising patient to call back to discuss questions on medications. Gave call back number.

## 2016-11-12 NOTE — Telephone Encounter (Signed)
Called and left message advising patient to call back to discuss questions on medications. Gave call back number.  

## 2016-12-06 ENCOUNTER — Other Ambulatory Visit: Payer: Self-pay | Admitting: Family Medicine

## 2016-12-06 DIAGNOSIS — I10 Essential (primary) hypertension: Secondary | ICD-10-CM

## 2016-12-28 ENCOUNTER — Ambulatory Visit (INDEPENDENT_AMBULATORY_CARE_PROVIDER_SITE_OTHER): Payer: Medicare Other | Admitting: Internal Medicine

## 2016-12-28 ENCOUNTER — Encounter: Payer: Self-pay | Admitting: Internal Medicine

## 2016-12-28 VITALS — BP 152/82 | HR 72 | Ht 62.5 in | Wt 132.0 lb

## 2016-12-28 DIAGNOSIS — E1159 Type 2 diabetes mellitus with other circulatory complications: Secondary | ICD-10-CM | POA: Diagnosis not present

## 2016-12-28 LAB — POCT GLYCOSYLATED HEMOGLOBIN (HGB A1C): Hemoglobin A1C: 6.2

## 2016-12-28 MED ORDER — GLIPIZIDE 5 MG PO TABS: ORAL_TABLET | ORAL | 3 refills | Status: DC

## 2016-12-28 NOTE — Patient Instructions (Addendum)
Please continue: - Metformin ER 500 mg with dinner.  Please change: - Glipizide to 5 mg before b'fast and 2.5 mg before dinner.  Do not eat anything sweet at night if sugars >100.  Please return in 3 months with your sugar log.

## 2016-12-28 NOTE — Progress Notes (Signed)
Patient ID: KIMMIE BERGGREN, female   DOB: Apr 01, 1927, 83 y.o.   MRN: [id number]   HPI: ELVIA AYDIN is a 83 y.o.-year-old female, referred by her PCP, Dr. Laury Axon, for management of DM2, dx in 1995 non-insulin-dependent, controlled, with complications (CKD stage 2-3, CAD, s/p PCA, CHF, DR). Last visit 3 mo ago  Last hemoglobin A1c was: Lab Results  Component Value Date   HGBA1C 7.0 (H) 09/28/2016   HGBA1C 5.8 03/29/2016   HGBA1C 6.3 01/10/2016   Pt was on a regimen of: - Glyburide 5 mg 2x a day, with food She had to stop Metformin 2/2 diarrhea. She could not afford Januvia.  Now on: - Metformin ER 500 mg with dinner - Glipizide 5 mg 2x a day: B'fast and Lunch (did not take it as dinnertime as advised) She restarted on Aloe Cran supplement.  Pt checks her sugars 2x a day and they are: - am: 120, 129-179, 209 >> 120-180 - 2h after b'fast: n/c - before lunch: n/c - 2h after lunch: n/c - before dinner: n/c - 2h after dinner: n/c - bedtime: 150-390, 413 >> 95, 103-288, 368 (ate late) - nighttime: n/c No lows. Lowest sugar was 112 >> 95;? she has hypoglycemia awareness. Highest sugar was 413 >> 368.  Glucometer: Prodigy  Pt's meals are: - Breakfast: Cereals, coffee - Lunch: Soup - Dinner: Soup, spaghetti - Snacks: Twice daily: Pretzels, popcorn  - + CKD, last BUN/creatinine:  Lab Results  Component Value Date   BUN 28 (H) 09/28/2016   BUN 21 04/25/2016   CREATININE 1.41 (H) 09/28/2016   CREATININE 1.26 (H) 04/25/2016   - last set of lipids: Lab Results  Component Value Date   CHOL 207 (H) 09/28/2016   HDL 47.20 09/28/2016   LDLCALC 127 (H) 09/28/2016   LDLDIRECT 194.0 09/29/2015   TRIG 164.0 (H) 09/28/2016   CHOLHDL 4 09/28/2016  On Crestor. - last eye exam was in 10/2016. + DR.  - no numbness and tingling in her feet. Started to see a podiatrist (Dr. Ardelle Anton). Sees him at the end of this mo.  She also has a history of HTN, HL.  ROS: Constitutional: no weight  gain/loss, no fatigue, no subjective hyperthermia/hypothermia, + excessive urination and nocturia Eyes: no blurry vision, no xerophthalmia ENT: no sore throat, no nodules palpated in throat, no dysphagia/odynophagia, no hoarseness Cardiovascular: no CP/SOB/palpitations/leg swelling Respiratory: no cough/SOB Gastrointestinal: no N/V/D/C Musculoskeletal: no muscle/joint aches Skin: no rashes, + hair loss Neurological: no tremors/numbness/tingling/dizziness  I reviewed pt's medications, allergies, PMH, social hx, family hx, and changes were documented in the history of present illness. Otherwise, unchanged from my initial visit note.  Past Medical History:  Diagnosis Date  . Arthritis   . Broken ribs   . CAD (coronary artery disease) 2006   PCI of RCA in [place], [place] stent placed  . CHF (congestive heart failure) (HCC)    10/2013 Echo: EF 50% and grade 1 diastolic dysfx, mild MR  . Diabetes mellitus   . Dysphagia 10/2014   esophagram: dysmotility, narrowing at GEJ and likely esophageal ring there, small HH, delayed tablet passage.   . Fatty liver 01/2013   seen on CT.   . Glaucoma   . Hyperlipidemia   . Hypertension   . Post-menopausal    Past Surgical History:  Procedure Laterality Date  . APPENDECTOMY    . BLADDER SURGERY  1960  . BREAST SURGERY     fatty tissue  . ESOPHAGOGASTRODUODENOSCOPY N/A 10/21/2015  Procedure: ESOPHAGOGASTRODUODENOSCOPY (EGD);  Surgeon: Hilarie Fredrickson, MD;  Location[facility] ENDOSCOPY;  Service: Endoscopy;  Laterality: N/A;  . [name] DILATION N/A 10/21/2015   Procedure: [name] DILATION;  Surgeon: Hilarie Fredrickson, MD;  Location: [facility] ENDOSCOPY;  Service: Endoscopy;  Laterality: N/A;  . stent implant     heart   Social History   Social History  . Marital status: Widowed    Spouse name: N/A  . Number of children: 4   Occupational History  . retired    Social History Main Topics  . Smoking status: Never Smoker  . Smokeless tobacco: Never Used  . Alcohol use  No  . Drug use: No   Current Outpatient Prescriptions on File Prior to Visit  Medication Sig Dispense Refill  . amLODipine (NORVASC) 10 MG tablet Take 1 tablet (10 mg total) by mouth daily. 90 tablet 1  . aspirin 81 MG tablet Take 81 mg by mouth daily.    . benazepril (LOTENSIN) 20 MG tablet TAKE 1 TABLET (20 MG TOTAL) BY MOUTH DAILY. 90 tablet 1  . Calcium Carbonate-Vit D-Min (CALCIUM 1200) 1200-1000 MG-UNIT CHEW Chew 1 tablet by mouth daily. 30 each 2  . carvedilol (COREG) 12.5 MG tablet TAKE 2 TABLETS IN THE MORNING AND 2 TABLETS IN THE EVENING. 270 tablet 0  . ciprofloxacin (CIPRO) 250 MG tablet Take 1 tablet (250 mg total) by mouth 2 (two) times daily. 6 tablet 0  . furosemide (LASIX) 40 MG tablet TAKE 1/2 TABLET DAILY 45 tablet 1  . glipiZIDE (GLUCOTROL) 5 MG tablet Take 1 tablet (5 mg total) by mouth 2 (two) times daily before a meal. 180 tablet 3  . glucose blood (ONETOUCH VERIO) test strip Check Blood sugar twice a day. Dx: E11.9 100 each prn  . latanoprost (XALATAN) 0.005 % ophthalmic solution Place 1 drop into both eyes at bedtime.     . metFORMIN (GLUCOPHAGE-XR) 500 MG 24 hr tablet Take 1 tablet (500 mg total) by mouth daily with supper. 90 tablet 3  . ONETOUCH DELICA LANCETS FINE MISC Check blood sugar twice daily. Dx: E11.9 100 each prn  . pantoprazole (PROTONIX) 40 MG tablet Take 1 tablet (40 mg total) by mouth daily. 14 tablet 0  . rosuvastatin (CRESTOR) 5 MG tablet Take 1 tablet (5 mg total) by mouth at bedtime. 90 tablet 0   No current facility-administered medications on file prior to visit.    No Known Allergies Family History  Problem Relation Age of Onset  . Diabetes Sister   . Cancer Sister     stomach  . Stroke Brother   . Coronary artery disease Mother     PE: BP (!) 152/82 (BP Location: Left Arm, Patient Position: Sitting)   Pulse 72   Ht 5' 2.5" (1.588 m)   Wt 132 lb (59.9 kg)   SpO2 97%   BMI 23.76 kg/m - Wt Readings from Last 3 Encounters:   12/28/16 132 lb (59.9 kg)  11/06/16 130 lb (59 kg)  09/28/16 132 lb 6.4 oz (60.1 kg)   Constitutional: normal weight, in NAD Eyes: PERRLA, EOMI, no exophthalmos ENT: moist mucous membranes, no thyromegaly, no cervical lymphadenopathy Cardiovascular: RRR, No MRG Respiratory: CTA B Gastrointestinal: abdomen soft, NT, ND, BS+ Musculoskeletal: no deformities, strength intact in all 4 Skin: moist, warm, no rashes Neurological: no tremor with outstretched hands, DTR normal in all 4  ASSESSMENT: 1. DM2, non-insulin-dependent, controlled, with complications - CKD stage 2-3 - CAD, s/p PCA - CHF  PLAN:  1. Patient with long-standing, uncontrolled diabetes, on oral antidiabetic regimen, which we adjusted at last visit. - Her HbA1c is at goal for her, however, she had hypoglycemia especially at bedtime >> at last visit, we started a low-dose metformin ER (she had diarrhea with a regular metformin) + changed to glipizide 10 minutes before meals. She is taking this before b'fast and before lunch, rather than dinner and feels low sometimes after dinner >> will decrease the dose to 5 mg in am and 2.5 mg in the pm >> before dinner. I advised her not to correct sugars <160 at night as she is now eating candy/drinking juice if sugars <160. - HbA1c 6.2% (great!) - I suggested to:  Patient Instructions  Please continue: - Glipizide 5 mg 10 min before b'fast and dinner. - Metformin ER 500 mg with dinner.  Please let me know if the sugars are consistently <80 or >200.  Please return in 3 months with your sugar log.   - continue checking sugars at different times of the day - check 1-2 times a day, rotating checks - advised for yearly eye exams >> she is up-to-date - Return to clinic in 3 mo with sugar log   Carlus Pavlovristina Kirin Pastorino, MD PhD [facility]

## 2016-12-28 NOTE — Addendum Note (Signed)
Addended by: Darene LamerHOMPSON, Jaydeen Odor T on: 12/28/2016 09:46 AM   Modules accepted: Orders

## 2017-01-01 ENCOUNTER — Encounter: Payer: Self-pay | Admitting: Podiatry

## 2017-01-01 ENCOUNTER — Ambulatory Visit (INDEPENDENT_AMBULATORY_CARE_PROVIDER_SITE_OTHER): Payer: Medicare Other | Admitting: Podiatry

## 2017-01-01 DIAGNOSIS — M79674 Pain in right toe(s): Secondary | ICD-10-CM | POA: Diagnosis not present

## 2017-01-01 DIAGNOSIS — B351 Tinea unguium: Secondary | ICD-10-CM | POA: Diagnosis not present

## 2017-01-01 DIAGNOSIS — M79675 Pain in left toe(s): Secondary | ICD-10-CM | POA: Diagnosis not present

## 2017-01-01 NOTE — Patient Instructions (Signed)

## 2017-01-01 NOTE — Progress Notes (Signed)
Subjective: 83 y.o. returns the office today for painful, elongated, thickened toenails which she cannot trim herself. Denies any redness or drainage around the nails. Denies any acute changes since last appointment and no new complaints today. Denies any systemic complaints such as fevers, chills, nausea, vomiting.   Objective: AAO 3, NAD DP/PT pulses palpable, CRT less than 3 seconds Nails hypertrophic, dystrophic, elongated, brittle, discolored 10.  There is tenderness overlying the nails 1-5 bilaterally. There is no surrounding erythema or drainage along the nail sites. No open lesions or pre-ulcerative lesions are identified. No other areas of tenderness bilateral lower extremities. No overlying edema, erythema, increased warmth. No pain with calf compression, swelling, warmth, erythema.  Assessment: Patient presents with symptomatic onychomycosis  Plan: -Treatment options including alternatives, risks, complications were discussed -Nails sharply debrided 10 without complication/bleeding. -Discussed daily foot inspection. If there are any changes, to call the office immediately.  -At her request completed paperwork for handicap parking -Follow-up in 3 months or sooner if any problems are to arise. In the meantime, encouraged to call the office with any questions, concerns, changes symptoms.  Ovid CurdMatthew Wagoner, DPM

## 2017-01-08 ENCOUNTER — Other Ambulatory Visit: Payer: Self-pay | Admitting: Family Medicine

## 2017-02-25 ENCOUNTER — Other Ambulatory Visit: Payer: Self-pay | Admitting: Family Medicine

## 2017-02-25 DIAGNOSIS — I10 Essential (primary) hypertension: Secondary | ICD-10-CM

## 2017-03-09 ENCOUNTER — Other Ambulatory Visit: Payer: Self-pay | Admitting: Family Medicine

## 2017-03-09 DIAGNOSIS — I10 Essential (primary) hypertension: Secondary | ICD-10-CM

## 2017-03-18 ENCOUNTER — Telehealth: Payer: Self-pay | Admitting: Family Medicine

## 2017-03-18 NOTE — Telephone Encounter (Signed)
Caller name: Relationship to patient: Self Can be reached: [phone]  Pharmacy:  Reason for call: Request call back from provider to discuss her legs

## 2017-03-19 NOTE — Telephone Encounter (Signed)
Patient states her legs became weak on Friday going up some steps. She had to hold onto her daughter to keep from falling. I informed her she would need appointment with  PCP to be able to properly evaluate (her last appt was 09/2016). She did agree and scheduled her for an acute appointment this coming Thursday 03/21/2017 at 11 AM for leg weakness..Marland Kitchen

## 2017-03-21 ENCOUNTER — Encounter: Payer: Self-pay | Admitting: Family Medicine

## 2017-03-21 ENCOUNTER — Ambulatory Visit (HOSPITAL_BASED_OUTPATIENT_CLINIC_OR_DEPARTMENT_OTHER)
Admission: RE | Admit: 2017-03-21 | Discharge: 2017-03-21 | Disposition: A | Discharge status: Home / Self Care | Payer: Medicare Other | Source: Ambulatory Visit | Attending: Family Medicine | Admitting: Family Medicine

## 2017-03-21 ENCOUNTER — Ambulatory Visit (INDEPENDENT_AMBULATORY_CARE_PROVIDER_SITE_OTHER): Payer: Medicare Other | Admitting: Family Medicine

## 2017-03-21 VITALS — BP 130/60 | HR 60 | Temp 97.8°F | Resp 16 | Ht 62.5 in | Wt 132.8 lb

## 2017-03-21 DIAGNOSIS — M545 Low back pain, unspecified: Secondary | ICD-10-CM

## 2017-03-21 DIAGNOSIS — R531 Weakness: Secondary | ICD-10-CM | POA: Insufficient documentation

## 2017-03-21 DIAGNOSIS — M5137 Other intervertebral disc degeneration, lumbosacral region: Secondary | ICD-10-CM | POA: Diagnosis not present

## 2017-03-21 DIAGNOSIS — I700 Atherosclerosis of aorta: Secondary | ICD-10-CM | POA: Insufficient documentation

## 2017-03-21 DIAGNOSIS — G3189 Other specified degenerative diseases of nervous system: Secondary | ICD-10-CM | POA: Insufficient documentation

## 2017-03-21 DIAGNOSIS — G8929 Other chronic pain: Secondary | ICD-10-CM

## 2017-03-21 DIAGNOSIS — E785 Hyperlipidemia, unspecified: Secondary | ICD-10-CM | POA: Diagnosis not present

## 2017-03-21 LAB — COMPREHENSIVE METABOLIC PANEL
ALT: 13 U/L (ref 0–35)
AST: 21 U/L (ref 0–37)
Albumin: 4.2 g/dL (ref 3.5–5.2)
Alkaline Phosphatase: 63 U/L (ref 39–117)
BUN: 36 mg/dL — ABNORMAL HIGH (ref 6–23)
CALCIUM: 10.2 mg/dL (ref 8.4–10.5)
CHLORIDE: 99 meq/L (ref 96–112)
CO2: 29 mEq/L (ref 19–32)
Creatinine, Ser: 1.44 mg/dL — ABNORMAL HIGH (ref 0.40–1.20)
GFR: 36.54 mL/min — ABNORMAL LOW (ref >60.00)
Glucose, Bld: 167 mg/dL — ABNORMAL HIGH (ref 70–99)
Potassium: 4.2 mEq/L (ref 3.5–5.1)
Sodium: 135 mEq/L (ref 135–145)
Total Bilirubin: 0.5 mg/dL (ref 0.2–1.2)
Total Protein: 8 g/dL (ref 6.0–8.3)

## 2017-03-21 LAB — CBC WITH DIFFERENTIAL/PLATELET
BASOS ABS: 0 10*3/uL (ref 0.0–0.1)
Basophils Relative: 0.9 % (ref 0.0–3.0)
EOS ABS: 0.3 10*3/uL (ref 0.0–0.7)
Eosinophils Relative: 5.5 % — ABNORMAL HIGH (ref 0.0–5.0)
HCT: 33.3 % — ABNORMAL LOW (ref 36.0–46.0)
Hemoglobin: 11 g/dL — ABNORMAL LOW (ref 12.0–15.0)
LYMPHS ABS: 1.7 10*3/uL (ref 0.7–4.0)
Lymphocytes Relative: 30.4 % (ref 12.0–46.0)
MCHC: 33.2 g/dL (ref 30.0–36.0)
MCV: 94.8 fl (ref 78.0–100.0)
MONO ABS: 0.4 10*3/uL (ref 0.1–1.0)
Monocytes Relative: 7.5 % (ref 3.0–12.0)
NEUTROS ABS: 3.1 10*3/uL (ref 1.4–7.7)
NEUTROS PCT: 55.7 % (ref 43.0–77.0)
PLATELETS: 240 10*3/uL (ref 150.0–400.0)
RBC: 3.52 Mil/uL — ABNORMAL LOW (ref 3.87–5.11)
RDW: 12 % (ref 11.5–15.5)
WBC: 5.5 10*3/uL (ref 4.0–10.5)

## 2017-03-21 LAB — LIPID PANEL
CHOL/HDL RATIO: 6
CHOLESTEROL: 284 mg/dL — AB (ref 0–200)
HDL: 50.6 mg/dL (ref >39.00)
NonHDL: 232.9
TRIGLYCERIDES: 211 mg/dL — AB (ref 0.0–149.0)
VLDL: 42.2 mg/dL — AB (ref 0.0–40.0)

## 2017-03-21 LAB — VITAMIN B12: Vitamin B-12: 1160 pg/mL — ABNORMAL HIGH (ref 211–911)

## 2017-03-21 LAB — TSH: TSH: 1.79 u[IU]/mL (ref 0.35–4.50)

## 2017-03-21 LAB — LDL CHOLESTEROL, DIRECT: LDL DIRECT: 182 mg/dL

## 2017-03-21 NOTE — Assessment & Plan Note (Signed)
?   Etiology from back  Check CT head   R/o cva -- doubt Check xray ls spine Home health for pt due to falls and weakness

## 2017-03-21 NOTE — Patient Instructions (Signed)
Deconditioning °Deconditioning refers to the changes in your body that occur during a period of inactivity. The changes happen in your heart, lungs, and muscles. They decrease your ability to be active, and they make you feel tired and weak. °There are three stages of deconditioning: °· Mild deconditioning. At this stage, you will notice a change in your ability to do your usual exercise activities, such as running, biking, or swimming. °· Moderate deconditioning. At this stage, you will notice a change in your ability to do normal everyday activities, such as walking, grocery shopping, and doing chores. °· Severe deconditioning. At this stage, you will notice a change in your ability to do minimal activity or normal self-care. ° °Deconditioning can occur after only a few days of inactivity. The longer the period of inactivity, the more severe the deconditioning will be, and the longer it will take to return to your previous level of functioning. °What are the causes? °Deconditioning is often caused by inactivity due to: °· Illnesses, such as cancer, stroke, heart attack, fibromyalgia, and chronic fatigue syndrome. °· Injuries, especially back injuries, broken bones, and ligament and tendon injuries. °· A long stay in the hospital. °· Pregnancy, especially if long periods of bed rest are needed. ° °What increases the risk? °This condition is more likely to develop in: °· People who are hospitalized. °· People on bed rest. °· People who are obese. °· People with poor nutrition. °· Elderly adults. °· People with injuries or illnesses that interfere with movement and activity. ° °What are the signs or symptoms? °Symptoms of deconditioning include: °· Weakness. °· Tiredness. °· Shortness of breath with minor exertion. °· A faster-than-normal heartbeat. You may not notice this without taking your pulse. °· Pain or discomfort with activity. °· Decreased strength. °· Decreased sense of balance. °· Decreased  endurance. °· Difficulty doing your usual forms of exercise. °· Difficulty doing activities of daily living, such as grocery shopping or chores. °· Difficulty walking around the house and doing basic self-care, such as getting to the bathroom, preparing meals, or doing laundry. ° °How is this diagnosed? °Deconditioning is diagnosed based on your medical history and a physical exam. During the physical exam, your health care provider will check for signs of deconditioning, such as: °· Decreased size of muscles. °· Decreased strength. °· Trouble with balance. °· Shortness of breath or abnormally increased heart rate after minor exertion. ° °How is this treated? °Treatment for deconditioning usually involves following a structured exercise program in which activity is increased gradually. Your health care provider will determine which exercises are right for you. The exercise program will likely include aerobic exercise and strength training: °· Aerobic exercise helps improve the functioning of the heart and lungs as well as the muscles. °· Strength training helps improve muscle size and strength. ° °Both of these types of exercise will improve your endurance. You may be referred to a physical therapist who can create a safe strengthening program for you to follow. °Follow these instructions at home: °· Follow the exercise program that is recommended by your health care provider or physical therapist. °· Do not increase your exercise any faster than directed. °· Eat a healthy diet. °· Do not use any products that contain nicotine or tobacco, such as cigarettes and e-cigarettes. If you need help quitting, ask your health care provider. °· Take over-the-counter and prescription medicines only as told by your health care provider. °· Keep all follow-up visits as told by your health care   provider. This is important. °Contact a health care provider if: °· You are not able to carry out the prescribed exercise program. °· You  are becoming more and more fatigued and weak. °· You become light-headed when rising to a sitting or standing position. °· Your level of endurance decreases after it has improved. °Get help right away if: °· You have chest pain. °· You are very short of breath. °· You have any episodes of passing out. °This information is not intended to replace advice given to you by your health care provider. Make sure you discuss any questions you have with your health care provider. °Document Released: 02/15/2014 Document Revised: 04/20/2016 Document Reviewed: 12/31/2015 °Elsevier Interactive Patient Education © 2018 [employer] ° °

## 2017-03-21 NOTE — Progress Notes (Signed)
Patient ID: Brenda Spence, female   DOB: 1926-10-17, 83 y.o.   MRN: [id number]     Subjective:  I acted as a Neurosurgeon for Dr. Zola Button.  Apolonio Schneiders, CMA   Patient ID: Brenda Spence, female    DOB: 1927-04-26, 83 y.o.   MRN: [id number]  Chief Complaint  Patient presents with  . Extremity Weakness    HPI  Patient is in today for leg weakness both legs.  She was going up some stairs on Friday and her legs all of a sudden felt like jello.   She almost fell but luckily her daughter was nearby.  Had annual wellness visit 01/16/2017 at home by [facility] nurse. Pt has fallen in past--- this episode was the first time that has occurred.  She has noticed her legs feeling weaker lately.   Her blood sugars have been fine---she sees endo soon.   Patient Care Team: Zola Button, Grayling Congress, DO as PCP - General (Family Medicine) Cephus Richer [facility])   Past Medical History:  Diagnosis Date  . Arthritis   . Broken ribs   . CAD (coronary artery disease) 2006   PCI of RCA in [place], [place] stent placed  . CHF (congestive heart failure) (HCC)    10/2013 Echo: EF 50% and grade 1 diastolic dysfx, mild MR  . Diabetes mellitus   . Dysphagia 10/2014   esophagram: dysmotility, narrowing at GEJ and likely esophageal ring there, small HH, delayed tablet passage.   . Fatty liver 01/2013   seen on CT.   . Glaucoma   . Hyperlipidemia   . Hypertension   . Post-menopausal     Past Surgical History:  Procedure Laterality Date  . APPENDECTOMY    . BLADDER SURGERY  1960  . BREAST SURGERY     fatty tissue  . ESOPHAGOGASTRODUODENOSCOPY N/A 10/21/2015   Procedure: ESOPHAGOGASTRODUODENOSCOPY (EGD);  Surgeon: Hilarie Fredrickson, MD;  Location[facility] ENDOSCOPY;  Service: Endoscopy;  Laterality: N/A;  . [name] DILATION N/A 10/21/2015   Procedure: [name] DILATION;  Surgeon: Hilarie Fredrickson, MD;  Location: [facility] ENDOSCOPY;  Service: Endoscopy;  Laterality: N/A;  . stent implant     heart    Family History  Problem Relation Age of Onset   . Diabetes Sister   . Cancer Sister        stomach  . Stroke Brother   . Coronary artery disease Mother   . Cancer Sister     Social History   Social History  . Marital status: Widowed    Spouse name: N/A  . Number of children: 4  . Years of education: N/A   Occupational History  . retired    Social History Main Topics  . Smoking status: Never Smoker  . Smokeless tobacco: Never Used  . Alcohol use No  . Drug use: No  . Sexual activity: No   Other Topics Concern  . Not on file   Social History Narrative   ** Merged History Encounter **        Outpatient Medications Prior to Visit  Medication Sig Dispense Refill  . amLODipine (NORVASC) 10 MG tablet Take 1 tablet (10 mg total) by mouth daily. 90 tablet 1  . aspirin 81 MG tablet Take 81 mg by mouth daily.    . benazepril (LOTENSIN) 20 MG tablet TAKE 1 TABLET (20 MG TOTAL) BY MOUTH DAILY. 90 tablet 1  . carvedilol (COREG) 12.5 MG tablet TAKE 2 TABLETS IN THE MORNING AND 2 TABLETS IN  THE EVENING. 270 tablet 0  . furosemide (LASIX) 40 MG tablet TAKE 1/2 TABLET DAILY 45 tablet 1  . glipiZIDE (GLUCOTROL) 5 MG tablet Take by mouth 5 mg in am and 2.5 mg before dinner. 180 tablet 3  . glucose blood (ONETOUCH VERIO) test strip Check Blood sugar twice a day. Dx: E11.9 100 each prn  . latanoprost (XALATAN) 0.005 % ophthalmic solution Place 1 drop into both eyes at bedtime.     . metFORMIN (GLUCOPHAGE-XR) 500 MG 24 hr tablet Take 1 tablet (500 mg total) by mouth daily with supper. 90 tablet 3  . ONETOUCH DELICA LANCETS FINE MISC Check blood sugar twice daily. Dx: E11.9 100 each prn  . rosuvastatin (CRESTOR) 5 MG tablet Take 1 tablet (5 mg total) by mouth at bedtime. 90 tablet 0  . amLODipine (NORVASC) 10 MG tablet TAKE 1 TABLET (10 MG TOTAL) BY MOUTH DAILY. 90 tablet 0  . Calcium Carbonate-Vit D-Min (CALCIUM 1200) 1200-1000 MG-UNIT CHEW Chew 1 tablet by mouth daily. 30 each 2  . ciprofloxacin (CIPRO) 250 MG tablet Take 1 tablet  (250 mg total) by mouth 2 (two) times daily. 6 tablet 0  . pantoprazole (PROTONIX) 40 MG tablet Take 1 tablet (40 mg total) by mouth daily. 14 tablet 0   No facility-administered medications prior to visit.     No Known Allergies  Review of Systems  Constitutional: Negative for chills, fever and malaise/fatigue.       Both legs   HENT: Negative for congestion and hearing loss.   Eyes: Negative for blurred vision and discharge.  Respiratory: Negative for cough, sputum production and shortness of breath.   Cardiovascular: Negative for chest pain, palpitations and leg swelling.  Gastrointestinal: Negative for abdominal pain, blood in stool, constipation, diarrhea, heartburn, nausea and vomiting.  Genitourinary: Negative for dysuria, frequency, hematuria and urgency.  Musculoskeletal: Positive for back pain and falls. Negative for joint pain and myalgias.  Skin: Negative for rash.  Neurological: Positive for weakness. Negative for dizziness, sensory change, loss of consciousness and headaches.  Endo/Heme/Allergies: Negative for environmental allergies. Does not bruise/bleed easily.  Psychiatric/Behavioral: Negative for depression and suicidal ideas. The patient is not nervous/anxious and does not have insomnia.        Objective:    Physical Exam  Constitutional: She is oriented to person, place, and time. She appears well-developed and well-nourished. No distress.  HENT:  Head: Normocephalic and atraumatic.  Eyes: Conjunctivae are normal.  Neck: Normal range of motion. No thyromegaly present.  Cardiovascular: Normal rate and regular rhythm.   Pulmonary/Chest: Effort normal and breath sounds normal. She has no wheezes.  Abdominal: Soft. Bowel sounds are normal. There is no tenderness.  Musculoskeletal: Normal range of motion. She exhibits no edema or deformity.  Neurological: She is alert and oriented to person, place, and time. She displays normal reflexes. She exhibits normal  muscle tone.  Strength-- slight weakness 4/5 r leg hip flexer and knee ext  Otherwise 5/5 strength both legs  Skin: Skin is warm and dry. She is not diaphoretic.  Psychiatric: She has a normal mood and affect.  Nursing note and vitals reviewed.   BP 130/60 (BP Location: Left Arm, Cuff Size: Normal)   Pulse 60   Temp 97.8 F (36.6 C) (Oral)   Resp 16   Ht 5' 2.5" (1.588 m)   Wt 132 lb 12.8 oz (60.2 kg)   SpO2 98%   BMI 23.90 kg/m  Wt Readings from Last 3 Encounters:  03/21/17 132 lb 12.8 oz (60.2 kg)  12/28/16 132 lb (59.9 kg)  11/06/16 130 lb (59 kg)   BP Readings from Last 3 Encounters:  03/21/17 130/60  12/28/16 (!) 152/82  11/06/16 130/74     Immunization History  Administered Date(s) Administered  . Influenza Split 08/01/2012  . Influenza, High Dose Seasonal PF 09/29/2015, 09/24/2016  . Influenza,inj,Quad PF,36+ Mos 08/18/2013, 09/20/2014  . Pneumococcal Polysaccharide-23 10/14/2010    Health Maintenance  Topic Date Due  . PNA vac Low Risk Adult (2 of 2 - PCV13) 03/29/2017 (Originally 10/15/2011)  . OPHTHALMOLOGY EXAM  04/19/2017  . INFLUENZA VACCINE  05/15/2017  . HEMOGLOBIN A1C  06/30/2017  . FOOT EXAM  01/01/2018  . TETANUS/TDAP  08/19/2023  . DEXA SCAN  Completed    Lab Results  Component Value Date   WBC 5.5 03/21/2017   HGB 11.0 (L) 03/21/2017   HCT 33.3 (L) 03/21/2017   PLT 240.0 03/21/2017   GLUCOSE 167 (H) 03/21/2017   CHOL 284 (H) 03/21/2017   TRIG 211.0 (H) 03/21/2017   HDL 50.60 03/21/2017   LDLDIRECT 182.0 03/21/2017   LDLCALC 127 (H) 09/28/2016   ALT 13 03/21/2017   AST 21 03/21/2017   NA 135 03/21/2017   K 4.2 03/21/2017   CL 99 03/21/2017   CREATININE 1.44 (H) 03/21/2017   BUN 36 (H) 03/21/2017   CO2 29 03/21/2017   TSH 1.79 03/21/2017   HGBA1C 6.2 12/28/2016   MICROALBUR 27.6 (H) 09/28/2016    Lab Results  Component Value Date   TSH 1.79 03/21/2017   Lab Results  Component Value Date   WBC 5.5 03/21/2017   HGB 11.0  (L) 03/21/2017   HCT 33.3 (L) 03/21/2017   MCV 94.8 03/21/2017   PLT 240.0 03/21/2017   Lab Results  Component Value Date   NA 135 03/21/2017   K 4.2 03/21/2017   CO2 29 03/21/2017   GLUCOSE 167 (H) 03/21/2017   BUN 36 (H) 03/21/2017   CREATININE 1.44 (H) 03/21/2017   BILITOT 0.5 03/21/2017   ALKPHOS 63 03/21/2017   AST 21 03/21/2017   ALT 13 03/21/2017   PROT 8.0 03/21/2017   ALBUMIN 4.2 03/21/2017   CALCIUM 10.2 03/21/2017   ANIONGAP 10 10/20/2015   GFR 36.54 (L) 03/21/2017   Lab Results  Component Value Date   CHOL 284 (H) 03/21/2017   Lab Results  Component Value Date   HDL 50.60 03/21/2017   Lab Results  Component Value Date   LDLCALC 127 (H) 09/28/2016   Lab Results  Component Value Date   TRIG 211.0 (H) 03/21/2017   Lab Results  Component Value Date   CHOLHDL 6 03/21/2017   Lab Results  Component Value Date   HGBA1C 6.2 12/28/2016         Assessment & Plan:   Problem List Items Addressed This Visit      Unprioritized   Hyperlipidemia   Relevant Orders   Lipid panel (Completed)   Weakness - Primary    ? Etiology from back  Check CT head   R/o cva -- doubt Check xray ls spine Home health for pt due to falls and weakness       Relevant Orders   CBC with Differential/Platelet (Completed)   TSH (Completed)   Vitamin B12 (Completed)   Vitamin D 1,25 dihydroxy   Comprehensive metabolic panel (Completed)   POCT Urinalysis Dipstick (Automated)   DG Lumbar Spine 2-3 Views (Completed)   CT HEAD WO CONTRAST (  Completed)   Ambulatory referral to Home Health    Other Visit Diagnoses    Chronic right-sided low back pain without sciatica       Relevant Orders   DG Lumbar Spine 2-3 Views (Completed)      I have discontinued Ms. Odenthal's CALCIUM 1200, pantoprazole, and ciprofloxacin. I am also having her maintain her aspirin, latanoprost, glucose blood, ONETOUCH DELICA LANCETS FINE, amLODipine, rosuvastatin, benazepril, metFORMIN, furosemide,  glipiZIDE, carvedilol, and Calcium Carb-Cholecalciferol (CALCIUM 1000 + D PO).  Meds ordered this encounter  Medications  . Calcium Carb-Cholecalciferol (CALCIUM 1000 + D PO)    Sig: Take 1 tablet by mouth daily.    CMA served as Neurosurgeon during this visit. History, Physical and Plan performed by medical provider. Documentation and orders reviewed and attested to.  Donato Schultz, DO

## 2017-03-22 ENCOUNTER — Telehealth: Payer: Self-pay | Admitting: Family Medicine

## 2017-03-22 NOTE — Telephone Encounter (Signed)
PCP aware

## 2017-03-22 NOTE — Telephone Encounter (Signed)
Caller name: Aundra MilletMegan  Relation to pt: In take from Home health services Call back number: [phone] Pharmacy:  Reason for call: Aund[facility]Megan [facility] stating is going to see the pt on 03-25-2017 (Monday) and wanted to inform PCP.

## 2017-03-25 ENCOUNTER — Encounter: Payer: Self-pay | Admitting: Internal Medicine

## 2017-03-25 ENCOUNTER — Ambulatory Visit (INDEPENDENT_AMBULATORY_CARE_PROVIDER_SITE_OTHER): Payer: Medicare Other | Admitting: Internal Medicine

## 2017-03-25 ENCOUNTER — Ambulatory Visit: Payer: Medicare Other | Admitting: Internal Medicine

## 2017-03-25 ENCOUNTER — Telehealth: Payer: Self-pay | Admitting: Family Medicine

## 2017-03-25 VITALS — BP 142/72 | HR 59 | Wt 131.0 lb

## 2017-03-25 DIAGNOSIS — E1159 Type 2 diabetes mellitus with other circulatory complications: Secondary | ICD-10-CM | POA: Diagnosis not present

## 2017-03-25 LAB — POCT GLYCOSYLATED HEMOGLOBIN (HGB A1C): HEMOGLOBIN A1C: 6.2

## 2017-03-25 MED ORDER — GLIPIZIDE 5 MG PO TABS: ORAL_TABLET | ORAL | 3 refills | Status: DC

## 2017-03-25 NOTE — Telephone Encounter (Signed)
Caller name: Sidonie DickensShaun [facility] Can be reached: [phone] Pharmacy:  Reason for call:  Needs verbal order to start care next week.

## 2017-03-25 NOTE — Telephone Encounter (Signed)
Relation to ZO:XWRUpt:self Call back [phone]  Reason for call:  Patient returning call regarding lab results

## 2017-03-25 NOTE — Telephone Encounter (Signed)
Patient requesting most recent labs please mail to home;  [address]  [place] [id number]

## 2017-03-25 NOTE — Addendum Note (Signed)
Addended by: Darene LamerHOMPSON, Jayvin Hurrell T on: 03/25/2017 02:26 PM   Modules accepted: Orders

## 2017-03-25 NOTE — Telephone Encounter (Signed)
Results mailed 

## 2017-03-25 NOTE — Progress Notes (Signed)
Patient ID: Brenda Spence, female   DOB: [birth year], 83 y.o.   MRN: [id number]   HPI: Brenda Spence is a 83 y.o.-year-old female, referred by her PCP, Dr. Laury AxonLowne, for management of DM2, dx in 1995 non-insulin-dependent, controlled, with complications (CKD stage 2-3, CAD, s/p PCA, CHF, DR). Last visit 3 mo ago.  Last hemoglobin A1c was: Lab Results  Component Value Date   HGBA1C 6.2 12/28/2016   HGBA1C 7.0 (H) 09/28/2016   HGBA1C 5.8 03/29/2016   Pt was on a regimen of: - Glyburide 5 mg 2x a day, with food She had to stop Metformin 2/2 diarrhea. She could not afford Januvia.  Now on: - Glipizide 5 mg 10 min before b'fast and  2.5 mg before dinner. - Metformin ER 500 mg with dinner. She restarted on Aloe Cran supplement.  Pt checks her sugars 2x a day and they are: - am: 120, 129-179, 209 >> 120-180 >> 130-174 - 2h after b'fast: n/c - before lunch: n/c >> 198 - 2h after lunch: n/c >> 127 - before dinner: n/c - 2h after dinner: n/c - bedtime: 150-390, 413 >> 95, 103-288, 368 (ate late) >>  118-204, occas, higher-294, 348 - nighttime: n/c No lows. Lowest sugar was 112 >> 95 >> 114;? she has hypoglycemia awareness. Highest sugar was 413 >> 368.  Glucometer: Prodigy  Pt's meals are: - Breakfast: Cereals, coffee - Lunch: Soup - Dinner: Soup, spaghetti - Snacks: Twice daily: Pretzels, popcorn  - she has CKD, last BUN/creatinine:  Lab Results  Component Value Date   BUN 36 (H) 03/21/2017   BUN 28 (H) 09/28/2016   CREATININE 1.44 (H) 03/21/2017   CREATININE 1.41 (H) 09/28/2016   - last set of lipids: Lab Results  Component Value Date   CHOL 284 (H) 03/21/2017   HDL 50.60 03/21/2017   LDLCALC 127 (H) 09/28/2016   LDLDIRECT 182.0 03/21/2017   TRIG 211.0 (H) 03/21/2017   CHOLHDL 6 03/21/2017  On Crestor. - last eye exam was in 10/2016. + DR.  - she denies numbness and tingling in her feet. Sees a podiatrist (Dr. Ardelle AntonWagoner).   She also has a history of HTN,  HL.  ROS: Constitutional: no weight gain/no weight loss, no fatigue, no subjective hyperthermia, no subjective hypothermia, + nocturia Eyes: no blurry vision, no xerophthalmia ENT: no sore throat, no nodules palpated in throat, no dysphagia, no odynophagia, no hoarseness Cardiovascular: no CP/no SOB/no palpitations/no leg swelling Respiratory: no cough/no SOB/no wheezing Gastrointestinal: no N/no V/no D/no C/no acid reflux Musculoskeletal: no muscle aches/no joint aches Skin: no rashes, + hair loss Neurological: no tremors/no numbness/no tingling/no dizziness  I reviewed pt's medications, allergies, PMH, social hx, family hx, and changes were documented in the history of present illness. Otherwise, unchanged from my initial visit note.  Past Medical History:  Diagnosis Date  . Arthritis   . Broken ribs   . CAD (coronary artery disease) 2006   [place]  . CHF (congestive heart failure) (HCC)    10/2013 Echo: EF 50% and grade 1 diastolic dysfx, mild MR  . Diabetes mellitus   . Dysphagia 10/2014   esophagram: dysmotility, narrowing at GEJ and likely esophageal ring there, small HH, delayed tablet passage.   . Fatty liver 01/2013   seen on CT.   . Glaucoma   . Hyperlipidemia   . Hypertension   . Post-menopausal    Past Surgical History:  Procedure Laterality Date  . APPENDECTOMY    .  BLADDER SURGERY  1960  . BREAST SURGERY     fatty tissue  . ESOPHAGOGASTRODUODENOSCOPY N/A 10/21/2015   Procedure: ESOPHAGOGASTRODUODENOSCOPY (EGD);  Surgeon: Hilarie Fredrickson, MD;  Location: [facility] ENDOSCOPY;  Service: Endoscopy;  Laterality: N/A;  . [name] DILATION N/A 10/21/2015   Procedure: [name] DILATION;  Surgeon: Hilarie Fredrickson, MD;  Location: [facility] ENDOSCOPY;  Service: Endoscopy;  Laterality: N/A;  . stent implant     heart   Social History   Social History  . Marital status: Widowed    Spouse name: N/A  . Number of children: 4   Occupational History  . retired     Social History Main Topics  . Smoking status: Never Smoker  . Smokeless tobacco: Never Used  . Alcohol use No  . Drug use: No   Current Outpatient Prescriptions on File Prior to Visit  Medication Sig Dispense Refill  . amLODipine (NORVASC) 10 MG tablet Take 1 tablet (10 mg total) by mouth daily. 90 tablet 1  . aspirin 81 MG tablet Take 81 mg by mouth daily.    . benazepril (LOTENSIN) 20 MG tablet TAKE 1 TABLET (20 MG TOTAL) BY MOUTH DAILY. 90 tablet 1  . Calcium Carb-Cholecalciferol (CALCIUM 1000 + D PO) Take 1 tablet by mouth daily.    . carvedilol (COREG) 12.5 MG tablet TAKE 2 TABLETS IN THE MORNING AND 2 TABLETS IN THE EVENING. 270 tablet 0  . furosemide (LASIX) 40 MG tablet TAKE 1/2 TABLET DAILY 45 tablet 1  . glipiZIDE (GLUCOTROL) 5 MG tablet Take by mouth 5 mg in am and 2.5 mg before dinner. 180 tablet 3  . glucose blood (ONETOUCH VERIO) test strip Check Blood sugar twice a day. Dx: E11.9 100 each prn  . latanoprost (XALATAN) 0.005 % ophthalmic solution Place 1 drop into both eyes at bedtime.     . metFORMIN (GLUCOPHAGE-XR) 500 MG 24 hr tablet Take 1 tablet (500 mg total) by mouth daily with supper. 90 tablet 3  . ONETOUCH DELICA LANCETS FINE MISC Check blood sugar twice daily. Dx: E11.9 100 each prn  . rosuvastatin (CRESTOR) 5 MG tablet Take 1 tablet (5 mg total) by mouth at bedtime. 90 tablet 0   No current facility-administered medications on file prior to visit.    No Known Allergies Family History  Problem Relation Age of Onset  . Diabetes Sister   . Cancer Sister        stomach  . Stroke Brother   . Coronary artery disease Mother   . Cancer Sister     PE: BP (!) 142/72 (BP Location: Left Arm, Patient Position: Sitting)   Pulse (!) 59   Wt 131 lb (59.4 kg)   SpO2 98%   BMI 23.58 kg/m - Wt Readings from Last 3 Encounters:  03/25/17 131 lb (59.4 kg)  03/21/17 132 lb 12.8 oz (60.2 kg)  12/28/16 132 lb (59.9 kg)   Constitutional: normal weight, in NAD Eyes:  PERRLA, EOMI, no exophthalmos ENT: moist mucous membranes, no thyromegaly, no cervical lymphadenopathy Cardiovascular: RRR, No MRG Respiratory: CTA B Gastrointestinal: abdomen soft, NT, ND, BS+ Musculoskeletal: no deformities, strength intact in all 4 Skin: moist, warm, no rashes Neurological: no tremor with outstretched hands, DTR normal in all 4  ASSESSMENT: 1. DM2, non-insulin-dependent, controlled, with complications - CKD stage 2-3 - CAD, s/p PCA - CHF  PLAN:  1. Patient with long-standing, prev. uncontrolled diabetes, on oral antidiabetic regimen, with sugars still slightly high, but last HbA1c 6.2%. At  this visit, sugars are the same. At bedtime, they are higher >> will increase Glipizide before this meal. - I suggested to:  Patient Instructions  Please continue: - Glipizide 5 mg 10 min before b'fast and  Increase to 5 mg before dinner. - Metformin ER 500 mg with dinner.  Please let me know if the sugars are consistently <80 or >200.  Please return in 4 months with your sugar log.   - today, HbA1c is 6.2% (stable, great!) - continue checking sugars at different times of the day - check 1x a day, rotating checks - advised for yearly eye exams >> she is UTD - Return to clinic in 4 mo with sugar log     Carlus Pavlov, MD PhD [facility] Endocr[facility]

## 2017-03-25 NOTE — Patient Instructions (Addendum)
Please continue: - Glipizide 5 mg 10 min before b'fast and  Increase to 5 mg before dinner. - Metformin ER 500 mg with dinner.  Please let me know if the sugars are consistently <80 or >200.  Please return in 4 months with your sugar log.

## 2017-03-26 LAB — VITAMIN D 1,25 DIHYDROXY
Vitamin D 1, 25 (OH)2 Total: 16 pg/mL — ABNORMAL LOW (ref 18–72)
Vitamin D3 1, 25 (OH)2: 16 pg/mL

## 2017-03-26 NOTE — Telephone Encounter (Signed)
Ok to give verbal 

## 2017-03-27 ENCOUNTER — Other Ambulatory Visit: Payer: Self-pay | Admitting: Family Medicine

## 2017-03-27 ENCOUNTER — Other Ambulatory Visit: Payer: Self-pay | Admitting: *Deleted

## 2017-03-27 MED ORDER — VITAMIN D (ERGOCALCIFEROL) 1.25 MG (50000 UNIT) PO CAPS: 50000.0000 [IU] | ORAL_CAPSULE | ORAL | 2 refills | Status: AC

## 2017-03-27 NOTE — Telephone Encounter (Signed)
Shaun notified.  He stated they needed to change date because patient was going out of town. Verbal given

## 2017-03-29 ENCOUNTER — Telehealth: Payer: Self-pay | Admitting: Family Medicine

## 2017-03-29 ENCOUNTER — Ambulatory Visit: Payer: Medicare Other | Admitting: Endocrinology

## 2017-03-29 NOTE — Telephone Encounter (Signed)
Relation to ZO:XWRUpt:self Call back [phone]   Reason for call:  Patient states she doesn't want to take her cholesterol medication and will continue to take OTC "garlic" please advise

## 2017-04-01 ENCOUNTER — Telehealth: Payer: Self-pay | Admitting: Family Medicine

## 2017-04-01 NOTE — Telephone Encounter (Signed)
Called [facility] informed of verbal ok 

## 2017-04-01 NOTE — Telephone Encounter (Signed)
[facility] name[facility] Relationship to patient:[phone] Can be reached: Pharmacy:  Reason for call:need verbal orders for PT 2x a week for 4 weeks

## 2017-04-01 NOTE — Telephone Encounter (Signed)
Pt refusal noted 

## 2017-04-02 ENCOUNTER — Ambulatory Visit (INDEPENDENT_AMBULATORY_CARE_PROVIDER_SITE_OTHER): Payer: Medicare Other | Admitting: Podiatry

## 2017-04-02 ENCOUNTER — Encounter: Payer: Self-pay | Admitting: Podiatry

## 2017-04-02 DIAGNOSIS — M79674 Pain in right toe(s): Secondary | ICD-10-CM

## 2017-04-02 DIAGNOSIS — M79675 Pain in left toe(s): Secondary | ICD-10-CM | POA: Diagnosis not present

## 2017-04-02 DIAGNOSIS — B351 Tinea unguium: Secondary | ICD-10-CM

## 2017-04-02 NOTE — Progress Notes (Signed)
Subjective: 83 y.o. returns the office today for painful, elongated, thickened toenails which she cannot trim herself. Denies any redness or drainage around the nails. Denies any acute changes since last appointment and no new complaints today. Denies any systemic complaints such as fevers, chills, nausea, vomiting.   Objective: AAO 3, NAD DP/PT pulses palpable, CRT less than 3 seconds Nails hypertrophic, dystrophic, elongated, brittle, discolored 10.  There is tenderness overlying the nails 1-5 bilaterally. There is no surrounding erythema or drainage along the nail sites. No open lesions or pre-ulcerative lesions are identified. No other areas of tenderness bilateral lower extremities. No overlying edema, erythema, increased warmth. No pain with calf compression, swelling, warmth, erythema.  Assessment: Patient presents with symptomatic onychomycosis  Plan: -Treatment options including alternatives, risks, complications were discussed -Nails sharply debrided 10 without complication/bleeding. -Discussed daily foot inspection. If there are any changes, to call the office immediately.  -At her request completed paperwork for handicap parking -Follow-up in 3 months or sooner if any problems are to arise. In the meantime, encouraged to call the office with any questions, concerns, changes symptoms.  Ovid CurdMatthew Mckinzey Entwistle, DPM

## 2017-04-29 ENCOUNTER — Telehealth: Payer: Self-pay | Admitting: *Deleted

## 2017-04-29 NOTE — Telephone Encounter (Signed)
Received Home Health Certification and Plan of Care; forwarded to provider/SLS 07/16

## 2017-05-03 ENCOUNTER — Other Ambulatory Visit: Payer: Self-pay | Admitting: Family Medicine

## 2017-05-03 DIAGNOSIS — Z1231 Encounter for screening mammogram for malignant neoplasm of breast: Secondary | ICD-10-CM

## 2017-05-07 ENCOUNTER — Encounter (HOSPITAL_BASED_OUTPATIENT_CLINIC_OR_DEPARTMENT_OTHER): Payer: Self-pay

## 2017-05-07 ENCOUNTER — Ambulatory Visit (HOSPITAL_BASED_OUTPATIENT_CLINIC_OR_DEPARTMENT_OTHER)
Admission: RE | Admit: 2017-05-07 | Discharge: 2017-05-07 | Disposition: A | Discharge status: Home / Self Care | Payer: Medicare Other | Source: Ambulatory Visit | Attending: Family Medicine | Admitting: Family Medicine

## 2017-05-07 DIAGNOSIS — Z1231 Encounter for screening mammogram for malignant neoplasm of breast: Secondary | ICD-10-CM | POA: Diagnosis not present

## 2017-05-18 ENCOUNTER — Other Ambulatory Visit: Payer: Self-pay | Admitting: Family Medicine

## 2017-05-18 DIAGNOSIS — I10 Essential (primary) hypertension: Secondary | ICD-10-CM

## 2017-05-20 ENCOUNTER — Other Ambulatory Visit: Payer: Self-pay | Admitting: Family Medicine

## 2017-05-27 ENCOUNTER — Other Ambulatory Visit: Payer: Self-pay | Admitting: Family Medicine

## 2017-05-27 DIAGNOSIS — I10 Essential (primary) hypertension: Secondary | ICD-10-CM

## 2017-06-11 ENCOUNTER — Other Ambulatory Visit: Payer: Self-pay | Admitting: Family Medicine

## 2017-07-02 ENCOUNTER — Encounter: Payer: Self-pay | Admitting: Podiatry

## 2017-07-02 ENCOUNTER — Ambulatory Visit (INDEPENDENT_AMBULATORY_CARE_PROVIDER_SITE_OTHER): Payer: Medicare Other | Admitting: Podiatry

## 2017-07-02 DIAGNOSIS — B351 Tinea unguium: Secondary | ICD-10-CM

## 2017-07-02 DIAGNOSIS — M79674 Pain in right toe(s): Secondary | ICD-10-CM

## 2017-07-02 DIAGNOSIS — M79675 Pain in left toe(s): Secondary | ICD-10-CM

## 2017-07-02 NOTE — Progress Notes (Signed)
Subjective: 83 y.o. returns the office today for painful, elongated, thickened toenails which she cannot trim herself. Denies any redness or drainage around the nails. Denies any acute changes since last appointment and no new complaints today. Denies any systemic complaints such as fevers, chills, nausea, vomiting.   Objective: AAO 3, NAD DP/PT pulses palpable, CRT less than 3 seconds Nails hypertrophic, dystrophic, elongated, brittle, discolored 10.  There is tenderness overlying the nails 1-5 bilaterally. There is no surrounding erythema or drainage along the nail sites. No open lesions or pre-ulcerative lesions are identified. No other areas of tenderness bilateral lower extremities. No overlying edema, erythema, increased warmth. No pain with calf compression, swelling, warmth, erythema. Overall, no acute changes.   Assessment: Patient presents with symptomatic onychomycosis  Plan: -Treatment options including alternatives, risks, complications were discussed -Nails sharply debrided 10 without complication/bleeding. -Discussed daily foot inspection. If there are any changes, to call the office immediately.  -At her request completed paperwork for handicap parking -Follow-up in 3 months or sooner if any problems are to arise. In the meantime, encouraged to call the office with any questions, concerns, changes symptoms.  Ovid Curd, DPM

## 2017-07-15 ENCOUNTER — Other Ambulatory Visit: Payer: Self-pay | Admitting: Family Medicine

## 2017-07-25 ENCOUNTER — Encounter: Payer: Self-pay | Admitting: Internal Medicine

## 2017-07-25 ENCOUNTER — Ambulatory Visit (INDEPENDENT_AMBULATORY_CARE_PROVIDER_SITE_OTHER): Payer: Medicare Other | Admitting: Internal Medicine

## 2017-07-25 VITALS — BP 156/70 | HR 66 | Ht 62.5 in | Wt 134.0 lb

## 2017-07-25 DIAGNOSIS — Z23 Encounter for immunization: Secondary | ICD-10-CM

## 2017-07-25 DIAGNOSIS — E1159 Type 2 diabetes mellitus with other circulatory complications: Secondary | ICD-10-CM | POA: Diagnosis not present

## 2017-07-25 DIAGNOSIS — E785 Hyperlipidemia, unspecified: Secondary | ICD-10-CM

## 2017-07-25 LAB — POCT GLYCOSYLATED HEMOGLOBIN (HGB A1C): Hemoglobin A1C: 6.2

## 2017-07-25 NOTE — Progress Notes (Signed)
Patient ID: Brenda Spence, female   DOB: May 01, 1927, 83 y.o.   MRN: [id number]   HPI: Brenda Spence is a 83 y.o.-year-old female, referred by her PCP, Dr. Laury Axon, for management of DM2, dx in 1995 non-insulin-dependent, controlled, with complications (CKD stage 2-3, CAD, s/p PCA, CHF, DR). Last visit 4 mo ago.  Last hemoglobin A1c was: Lab Results  Component Value Date   HGBA1C 6.2 03/25/2017   HGBA1C 6.2 12/28/2016   HGBA1C 7.0 (H) 09/28/2016   Pt was on a regimen of: - Glyburide 5 mg 2x a day, with food She had to stop Metformin 2/2 diarrhea. She could not afford Januvia.  Now on: - Glipizide 5 mg 10 min before b'fast and 2.5 >> 5 mg before dinner. - Metformin ER 500 mg with dinner. She restarted on Aloe Cran supplement.  Pt checks her sugars 2x a day: - am: 120, 129-179, 209 >> 120-180 >> 130-174 >> 117-154, 172, 255 x1 - 2h after b'fast: n/c - before lunch: n/c >> 198 >> n/c - 2h after lunch: n/c >> 127 >> n/c  - before dinner: n/c - 2h after dinner: n/c - bedtime:  118-204, occas, higher-294, 348 >> 102-177, 215, 254 - nighttime: n/c Lowest sugar was 112 >> 95 >> 114 >> 102; ? hypoglycemia awareness. Highest sugar was 413 >> 368 >> 274  Glucometer: Prodigy  Pt's meals are: - Breakfast: Cereals, coffee - Lunch: Soup - Dinner: Soup, spaghetti - Snacks: Twice daily: Pretzels, popcorn  - + CKD, last BUN/creatinine:  Lab Results  Component Value Date   BUN 36 (H) 03/21/2017   BUN 28 (H) 09/28/2016   CREATININE 1.44 (H) 03/21/2017   CREATININE 1.41 (H) 09/28/2016   Lab Results  Component Value Date   GFRAA 57 (L) 10/20/2015   GFRAA 43 (L) 10/19/2015   GFRAA 41 (L) 12/20/2014   GFRAA 52 (L) 08/23/2013   Lab Results  Component Value Date   MICRALBCREAT 92.2 (H) 09/28/2016   MICRALBCREAT 87.8 (H) 03/17/2015   MICRALBCREAT 401.3 (H) 12/20/2014   MICRALBCREAT 704.3 (H) 11/17/2013   MICRALBCREAT 434.3 (H) 08/18/2013   - + HL; last set of lipids: Lab Results   Component Value Date   CHOL 284 (H) 03/21/2017   HDL 50.60 03/21/2017   LDLCALC 127 (H) 09/28/2016   LDLDIRECT 182.0 03/21/2017   TRIG 211.0 (H) 03/21/2017   CHOLHDL 6 03/21/2017  She was suggested to start Pravachol after the results above returned >> refused. Takes Garlic. - last eye exam was in 10/2016 >> + DR.  - no numbness and tingling in her feet. Sees a podiatrist (Dr. Ardelle Anton).   She also has a history of HTN, HL.  ROS: Constitutional: no weight gain/no weight loss, no fatigue, no subjective hyperthermia, no subjective hypothermia, + nocturia Eyes: no blurry vision, no xerophthalmia ENT: no sore throat, no nodules palpated in throat, no dysphagia, no odynophagia, no hoarseness Cardiovascular: no CP/no SOB/no palpitations/no leg swelling Respiratory: no cough/no SOB/no wheezing Gastrointestinal: no N/no V/no D/no C/no acid reflux Musculoskeletal: no muscle aches/no joint aches Skin: no rashes, no hair loss Neurological: no tremors/no numbness/no tingling/no dizziness  I reviewed pt's medications, allergies, PMH, social hx, family hx, and changes were documented in the history of present illness. Otherwise, unchanged from my initial visit note.   Past Medical History:  Diagnosis Date  . Arthritis   . Broken ribs   . CAD (coronary artery disease) 2006   PCI of RCA in [place], [place]  stent placed  . CHF (congestive heart failure) (HCC)    10/2013 Echo: EF 50% and grade 1 diastolic dysfx, mild MR  . Diabetes mellitus   . Dysphagia 10/2014   esophagram: dysmotility, narrowing at GEJ and likely esophageal ring there, small HH, delayed tablet passage.   . Fatty liver 01/2013   seen on CT.   . Glaucoma   . Hyperlipidemia   . Hypertension   . Post-menopausal    Past Surgical History:  Procedure Laterality Date  . APPENDECTOMY    . BLADDER SURGERY  1960  . BREAST BIOPSY Left   . BREAST SURGERY     fatty tissue  . ESOPHAGOGASTRODUODENOSCOPY N/A 10/21/2015   Procedure:  ESOPHAGOGASTRODUODENOSCOPY (EGD);  Surgeon: Hilarie Fredrickson, MD;  Location[facility]COPY;  Service: Endoscopy;  Laterality: N/A;  . [name] DILATION N/A 10/21/2015   Procedure: [name] DILATION;  Surgeon: Hilarie Fredrickson, MD;  Location: [facility] ENDOSCOPY;  Service: Endoscopy;  Laterality: N/A;  . stent implant     heart   Social History   Social History  . Marital status: Widowed    Spouse name: N/A  . Number of children: 4   Occupational History  . retired    Social History Main Topics  . Smoking status: Never Smoker  . Smokeless tobacco: Never Used  . Alcohol use No  . Drug use: No   Current Outpatient Prescriptions on File Prior to Visit  Medication Sig Dispense Refill  . amLODipine (NORVASC) 10 MG tablet Take 1 tablet (10 mg total) by mouth daily. 90 tablet 1  . aspirin 81 MG tablet Take 81 mg by mouth daily.    . benazepril (LOTENSIN) 20 MG tablet TAKE 1 TABLET (20 MG TOTAL) BY MOUTH DAILY. 90 tablet 1  . Calcium Carb-Cholecalciferol (CALCIUM 1000 + D PO) Take 1 tablet by mouth daily.    . carvedilol (COREG) 12.5 MG tablet TAKE 2 TABLETS IN THE MORNING AND 2 TABLETS IN THE EVENING. 270 tablet 0  . furosemide (LASIX) 40 MG tablet TAKE 1/2 TABLET BY MOUTH DAILY 45 tablet 1  . glipiZIDE (GLUCOTROL) 5 MG tablet Take by mouth 5 mg in am and 5 mg before dinner. 180 tablet 3  . glucose blood (ONETOUCH VERIO) test strip Check Blood sugar twice a day. Dx: E11.9 100 each prn  . latanoprost (XALATAN) 0.005 % ophthalmic solution Place 1 drop into both eyes at bedtime.     . metFORMIN (GLUCOPHAGE-XR) 500 MG 24 hr tablet Take 1 tablet (500 mg total) by mouth daily with supper. 90 tablet 3  . ONETOUCH DELICA LANCETS FINE MISC Check blood sugar twice daily. Dx: E11.9 100 each prn  . rosuvastatin (CRESTOR) 5 MG tablet Take 1 tablet (5 mg total) by mouth at bedtime. 90 tablet 0  . Vitamin D, Ergocalciferol, (DRISDOL) 50000 units CAPS capsule Take 1 capsule (50,000 Units total) by mouth every 7 (seven)  days. 4 capsule 2   No current facility-administered medications on file prior to visit.    No Known Allergies Family History  Problem Relation Age of Onset  . Diabetes Sister   . Cancer Sister        stomach  . Stroke Brother   . Coronary artery disease Mother   . Cancer Sister     PE: BP (!) 156/70   Pulse 66   Ht 5' 2.5" (1.588 m)   Wt 134 lb (60.8 kg)   SpO2 97%   BMI 24.12 kg/m - Wt  Readings from Last 3 Encounters:  07/25/17 134 lb (60.8 kg)  03/25/17 131 lb (59.4 kg)  03/21/17 132 lb 12.8 oz (60.2 kg)   Constitutional: normal weight, in NAD Eyes: PERRLA, EOMI, no exophthalmos ENT: moist mucous membranes, no thyromegaly, no cervical lymphadenopathy Cardiovascular: RRR, No MRG Respiratory: CTA B Gastrointestinal: abdomen soft, NT, ND, BS+ Musculoskeletal: no deformities, strength intact in all 4 Skin: moist, warm, no rashes Neurological: no tremor with outstretched hands, DTR normal in all 4  ASSESSMENT: 1. DM2, non-insulin-dependent, controlled, with complications - CKD stage 2-3 - CAD, s/p PCA - CHF  2. HL  PLAN:  1. Patient with long-standing, now with controlled DM2, only with occasional hyperglycemic spikes, but with overall sugars at or close to goal. She does not make notes in her log regarding the reason for her high CBGs >> advised to start doing so. OTW, no changes needed in her regimen. - I suggested to:  Patient Instructions  Please continue: - Glipizide 5 mg before b'fast and 5 mg before dinner - Metformin ER 500 mg with dinner.  Please write down comments about the sugars if they are out of the ordinary for you.   Please return in 6 months with your sugar log.   - today, HbA1c is 6.2% (stable, great!) - continue checking sugars at different times of the day - check 1x a day, rotating checks - advised for yearly eye exams >> she is UTD - UTD with flu shot - Return to clinic in 6 mo with sugar log   2. HL - reviewed lipid panel from  03/2017 >> LDL much higher... >> per PCP, she was advised to start Pravachol 20. She refused >> on Garlic  Carlus Pavlov, MD PhD [facility]

## 2017-07-25 NOTE — Patient Instructions (Addendum)
Please continue: - Glipizide 5 mg before b'fast and 5 mg before dinner - Metformin ER 500 mg with dinner.  Please write down comments about the sugars if they are out of the ordinary for you.   Please return in 6 months with your sugar log.

## 2017-08-29 ENCOUNTER — Other Ambulatory Visit: Payer: Self-pay

## 2017-08-29 MED ORDER — GLIPIZIDE 5 MG PO TABS: ORAL_TABLET | ORAL | 3 refills | Status: AC

## 2017-09-14 DEATH — deceased

## 2017-10-01 ENCOUNTER — Ambulatory Visit: Payer: Medicare Other | Admitting: Podiatry

## 2017-10-02 NOTE — Progress Notes (Deleted)
Subjective:   Brenda Spence is a 83 y.o. female who presents for Medicare Annual (Subsequent) preventive examination.  Review of Systems: No ROS.  Medicare Wellness Visit. Additional risk factors are reflected in the social history.   Sleep patterns: Home Safety/Smoke Alarms: Feels safe in home. Smoke alarms in place.     Female:       Mammo- last 05/07/17: BI-RADS CATEGORY  1: Negative.      Dexa scan- Last 11/04/15: osteopenia           Objective:     Vitals: There were no vitals taken for this visit.  There is no height or weight on file to calculate BMI.  Advanced Directives 09/24/2016 10/19/2015 10/19/2015 04/30/2015  Does Patient Have a Medical Advance Directive? Yes No No No  Type of Estate agent of [place];Living will - - -  Copy of Healthcare Power of Attorney in Chart? No - copy requested - - -  Would patient like information on creating a medical advance directive? - No - patient declined information - No - patient declined information    Tobacco Social History   Tobacco Use  Smoking Status Never Smoker  Smokeless Tobacco Never Used     Counseling given: Not Answered   Clinical Intake:                       Past Medical History:  Diagnosis Date  . Arthritis   . Broken ribs   . CAD (coronary artery disease) 2006   PCI of RCA in [place], [place] stent placed  . CHF (congestive heart failure) (HCC)    10/2013 Echo: EF 50% and grade 1 diastolic dysfx, mild MR  . Diabetes mellitus   . Dysphagia 10/2014   esophagram: dysmotility, narrowing at GEJ and likely esophageal ring there, small HH, delayed tablet passage.   . Fatty liver 01/2013   seen on CT.   . Glaucoma   . Hyperlipidemia   . Hypertension   . Post-menopausal    Past Surgical History:  Procedure Laterality Date  . APPENDECTOMY    . BLADDER SURGERY  1960  . BREAST BIOPSY Left   . BREAST SURGERY     fatty tissue  . ESOPHAGOGASTRODUODENOSCOPY N/A 10/21/2015   Procedure: ESOPHAGOGASTRODUODENOSCOPY (EGD);  Surgeon: Hilarie Fredrickson, MD;  Location: [facility] ENDOSCOPY;  Service: Endoscopy;  Laterality: N/A;  . [name] DILATION N/A 10/21/2015   Procedure: [name] DILATION;  Surgeon: Hilarie Fredrickson, MD;  Location: [facility] ENDOSCOPY;  Service: Endoscopy;  Laterality: N/A;  . stent implant     heart   Family History  Problem Relation Age of Onset  . Diabetes Sister   . Cancer Sister        stomach  . Stroke Brother   . Coronary artery disease Mother   . Cancer Sister    Social History   Socioeconomic History  . Marital status: Widowed    Spouse name: Not on file  . Number of children: 4  . Years of education: Not on file  . Highest education level: Not on file  Social Needs  . Financial resource strain: Not on file  . Food insecurity - worry: Not on file  . Food insecurity - inability: Not on file  . Transportation needs - medical: Not on file  . Transportation needs - non-medical: Not on file  Occupational History  . Occupation: retired  Tobacco Use  . Smoking status: Never Smoker  .  Smokeless tobacco: Never Used  Substance and Sexual Activity  . Alcohol use: No  . Drug use: No  . Sexual activity: No  Other Topics Concern  . Not on file  Social History Narrative   ** Merged History Encounter **        Outpatient Encounter Medications as of 10/03/2017  Medication Sig  . amLODipine (NORVASC) 10 MG tablet Take 1 tablet (10 mg total) by mouth daily.  Marland Kitchen. aspirin 81 MG tablet Take 81 mg by mouth daily.  . benazepril (LOTENSIN) 20 MG tablet TAKE 1 TABLET (20 MG TOTAL) BY MOUTH DAILY.  . Calcium Carb-Cholecalciferol (CALCIUM 1000 + D PO) Take 1 tablet by mouth daily.  . carvedilol (COREG) 12.5 MG tablet TAKE 2 TABLETS IN THE MORNING AND 2 TABLETS IN THE EVENING.  . furosemide (LASIX) 40 MG tablet TAKE 1/2 TABLET BY MOUTH DAILY  . glipiZIDE (GLUCOTROL) 5 MG tablet Take by mouth 5 mg in am and 5 mg before dinner.  Marland Kitchen. glucose blood (ONETOUCH VERIO) test  strip Check Blood sugar twice a day. Dx: E11.9  . latanoprost (XALATAN) 0.005 % ophthalmic solution Place 1 drop into both eyes at bedtime.   . metFORMIN (GLUCOPHAGE-XR) 500 MG 24 hr tablet Take 1 tablet (500 mg total) by mouth daily with supper.  Letta Pate. ONETOUCH DELICA LANCETS FINE MISC Check blood sugar twice daily. Dx: E11.9  . rosuvastatin (CRESTOR) 5 MG tablet Take 1 tablet (5 mg total) by mouth at bedtime.  . Vitamin D, Ergocalciferol, (DRISDOL) 50000 units CAPS capsule Take 1 capsule (50,000 Units total) by mouth every 7 (seven) days.   No facility-administered encounter medications on file as of 10/03/2017.     Activities of Daily Living No flowsheet data found.  Patient Care Team: Zola ButtonLowne Chase, Grayling CongressYvonne R, DO as PCP - General (Family Medicine) Cephus RicherJoseph Perez [facility](Optometry)    Assessment:   This is a routine wellness examination for Seniya. Physical assessment deferred to PCP.   Exercise Activities and Dietary recommendations   Diet (meal preparation, eat out, water intake, caffeinated beverages, dairy products, fruits and vegetables): {Desc; diets:16563} Breakfast: Lunch:  Dinner:      Goals    None      Fall Risk Fall Risk  09/24/2016 09/29/2015 09/20/2014 08/31/2013 08/18/2013  Falls in the past year? Yes No No Yes Yes  Number falls in past yr: 1 - - - -  Injury with Fall? No - - - -  Risk for fall due to : - - - Impaired balance/gait Impaired mobility  Risk for fall due to: Comment - - - - pt fell on escalator   Depression Screen PHQ 2/9 Scores 09/24/2016 09/29/2015 09/20/2014 08/18/2013  PHQ - 2 Score 0 0 0 0     Cognitive Function MMSE - Mini Mental State Exam 09/24/2016  Orientation to time 5  Orientation to Place 5  Registration 3  Attention/ Calculation 5  Recall 2  Language- name 2 objects 2  Language- repeat 1  Language- follow 3 step command 3  Language- read & follow direction 1  Write a sentence 1  Copy design 1  Total score 29        Immunization  History  Administered Date(s) Administered  . Influenza Split 08/01/2012  . Influenza, High Dose Seasonal PF 09/29/2015, 09/24/2016, 07/25/2017  . Influenza,inj,Quad PF,6+ Mos 08/18/2013, 09/20/2014  . Pneumococcal Polysaccharide-23 10/14/2010    Screening Tests Health Maintenance  Topic Date Due  . PNA vac Low Risk Adult (  2 of 2 - PCV13) 10/15/2011  . OPHTHALMOLOGY EXAM  04/19/2017  . FOOT EXAM  01/01/2018  . HEMOGLOBIN A1C  01/23/2018  . TETANUS/TDAP  08/19/2023  . INFLUENZA VACCINE  Completed  . DEXA SCAN  Completed       Plan:   ***   I have personally reviewed and noted the following in the patient's chart:   . Medical and social history . Use of alcohol, tobacco or illicit drugs  . Current medications and supplements . Functional ability and status . Nutritional status . Physical activity . Advanced directives . List of other physicians . Hospitalizations, surgeries, and ER visits in previous 12 months . Vitals . Screenings to include cognitive, depression, and falls . Referrals and appointments  In addition, I have reviewed and discussed with patient certain preventive protocols, quality metrics, and best practice recommendations. A written personalized care plan for preventive services as well as general preventive health recommendations were provided to patient.     Avon GullyBritt, Alondria Mousseau Angel, [place]RN  10/02/2017

## 2017-10-03 ENCOUNTER — Ambulatory Visit: Payer: Medicare Other | Admitting: *Deleted

## 2017-10-03 ENCOUNTER — Ambulatory Visit: Payer: Medicare Other | Admitting: Family Medicine

## 2017-10-10 ENCOUNTER — Other Ambulatory Visit: Payer: Self-pay | Admitting: *Deleted

## 2017-10-10 MED ORDER — CARVEDILOL 12.5 MG PO TABS: ORAL_TABLET | ORAL | 0 refills | Status: AC

## 2017-10-22 ENCOUNTER — Other Ambulatory Visit: Payer: Self-pay

## 2017-10-22 MED ORDER — METFORMIN HCL ER 500 MG PO TB24: 500.0000 mg | ORAL_TABLET | Freq: Every day | ORAL | 0 refills | Status: AC

## 2017-11-12 ENCOUNTER — Other Ambulatory Visit: Payer: Self-pay | Admitting: Family Medicine

## 2017-11-12 DIAGNOSIS — I10 Essential (primary) hypertension: Secondary | ICD-10-CM

## 2017-11-24 ENCOUNTER — Other Ambulatory Visit: Payer: Self-pay | Admitting: Family Medicine

## 2017-11-24 DIAGNOSIS — I10 Essential (primary) hypertension: Secondary | ICD-10-CM

## 2018-01-23 ENCOUNTER — Ambulatory Visit: Payer: Medicare Other | Admitting: Internal Medicine

## 2018-01-23 DIAGNOSIS — Z0289 Encounter for other administrative examinations: Secondary | ICD-10-CM

## 2018-01-23 NOTE — Progress Notes (Deleted)
Patient ID: Brenda Spence, female   DOB: January 28, 1927, 84 y.o.   MRN: [id number]   HPI: Brenda Spence is a 84 y.o.-year-old female,  returning for follow-up for  DM2, dx in 1995 non-insulin-dependent, controlled, with complications (CKD stage 2-3, CAD, s/p PCA, CHF, DR). Last visit 6 months ago.  Last hemoglobin A1c was: Lab Results  Component Value Date   HGBA1C 6.2 07/25/2017   HGBA1C 6.2 03/25/2017   HGBA1C 6.2 12/28/2016   HGBA1C 7.0 (H) 09/28/2016   HGBA1C 5.8 03/29/2016   HGBA1C 6.3 01/10/2016   HGBA1C 7.0 (H) 09/29/2015   HGBA1C 6.6 (H) 03/17/2015   HGBA1C 6.7 (H) 12/20/2014   HGBA1C 7.1 (H) 09/20/2014   She is on: - Glipizide 5 mg 2x a day before meals - Metformin ER 500mg  with dinner. She restarted on Aloe Cran supplement. She had to stop Metformin 2/2 diarrhea. She could not afford Januvia.  Pt checks her sugars twice a day: - am: 130-174 >> 117-154, 172, 255 x1 - 2h after b'fast: n/c - before lunch: n/c >> 198 >> n/c - 2h after lunch: n/c >> 127 >> n/c  - before dinner: n/c - 2h after dinner: n/c - bedtime:  118-204, 294, 348 >> 102-177, 215, 254 - nighttime: n/c Lowest sugar was 102 >> *** ; it is unclear at which level she has hypoglycemia awareness. Highest sugar was274 >> ***.  Glucometer: Prodigy  Pt's meals are: - Breakfast: Cereals, coffee - Lunch: Soup - Dinner: Soup, spaghetti - Snacks: Twice daily: Pretzels, popcorn  - + CKD, last BUN/creatinine:  Lab Results  Component Value Date   BUN 36 (H) 03/21/2017   BUN 28 (H) 09/28/2016   CREATININE 1.44 (H) 03/21/2017   CREATININE 1.41 (H) 09/28/2016   Lab Results  Component Value Date   GFRAA 57 (L) 10/20/2015   GFRAA 43 (L) 10/19/2015   GFRAA 41 (L) 12/20/2014   GFRAA 52 (L) 08/23/2013   She has an elevated ACR: Lab Results  Component Value Date   MICRALBCREAT 92.2 (H) 09/28/2016   MICRALBCREAT 87.8 (H) 03/17/2015   MICRALBCREAT 401.3 (H) 12/20/2014   MICRALBCREAT 704.3 (H) 11/17/2013    MICRALBCREAT 434.3 (H) 08/18/2013   - + HL; last set of lipids: Lab Results  Component Value Date   CHOL 284 (H) 03/21/2017   HDL 50.60 03/21/2017   LDLCALC 127 (H) 09/28/2016   LDLDIRECT 182.0 03/21/2017   TRIG 211.0 (H) 03/21/2017   CHOLHDL 6 03/21/2017  She was suggested to start Pravachol after the results above returned >> she refused.  She takes garlic. - last eye exam was in 10/2016: + DR -No numbness and tingling in her feet. Sees a podiatrist (Dr. Ardelle Anton).   She also has a history of HTN, HL.  ROS: Constitutional: no weight gain/no weight loss, no fatigue, no subjective hyperthermia, no subjective hypothermia Eyes: no blurry vision, no xerophthalmia ENT: no sore throat, no nodules palpated in throat, no dysphagia, no odynophagia, no hoarseness Cardiovascular: no CP/no SOB/no palpitations/no leg swelling Respiratory: no cough/no SOB/no wheezing Gastrointestinal: no N/no V/no D/no C/no acid reflux Musculoskeletal: no muscle aches/no joint aches Skin: no rashes, no hair loss Neurological: no tremors/no numbness/no tingling/no dizziness  I reviewed pt's medications, allergies, PMH, social hx, family hx, and changes were documented in the history of present illness. Otherwise, unchanged from my initial visit note.   Past Medical History:  Diagnosis Date  . Arthritis   . Broken ribs   . CAD (coronary  artery disease) 2006   [place]  . CHF (congestive heart failure) (HCC)    10/2013 Echo: EF 50% and grade 1 diastolic dysfx, mild MR  . Diabetes mellitus   . Dysphagia 10/2014   esophagram: dysmotility, narrowing at GEJ and likely esophageal ring there, small HH, delayed tablet passage.   . Fatty liver 01/2013   seen on CT.   . Glaucoma   . Hyperlipidemia   . Hypertension   . Post-menopausal    Past Surgical History:  Procedure Laterality Date  . APPENDECTOMY    . BLADDER SURGERY  1960  . BREAST BIOPSY Left   . BREAST SURGERY      fatty tissue  . ESOPHAGOGASTRODUODENOSCOPY N/A 10/21/2015   Procedure: ESOPHAGOGASTRODUODENOSCOPY (EGD);  Surgeon: Hilarie FredricksonJohn N Perry, MD;  [facility];  Service: Endoscopy;  Laterality: N/A;  . Elease HashimotoMALONEY DILATION N/A 10/21/2015   Procedure: Elease HashimotoMALONEY DILATION;  Surgeon: Hilarie FredricksonJohn N Perry, MD;  [facility];  Service: Endoscopy;  Laterality: N/A;  . stent implant     heart   Social History   Social History  . Marital status: Widowed    Spouse name: N/A  . Number of children: 4   Occupational History  . retired    Social History Main Topics  . Smoking status: Never Smoker  . Smokeless tobacco: Never Used  . Alcohol use No  . Drug use: No   Current Outpatient Medications on File Prior to Visit  Medication Sig Dispense Refill  . amLODipine (NORVASC) 10 MG tablet Take 1 tablet (10 mg total) by mouth daily. 90 tablet 1  . amLODipine (NORVASC) 10 MG tablet TAKE 1 TABLET BY MOUTH EVERY DAY **MUST HAVE OFFICE VISIT** 90 tablet 0  . aspirin 81 MG tablet Take 81 mg by mouth daily.    . benazepril (LOTENSIN) 20 MG tablet TAKE 1 TABLET BY MOUTH EVERY DAY 90 tablet 1  . Calcium Carb-Cholecalciferol (CALCIUM 1000 + D PO) Take 1 tablet by mouth daily.    . carvedilol (COREG) 12.5 MG tablet TAKE 2 TABLETS IN THE MORNING AND 2 TABLETS IN THE EVENING. 270 tablet 0  . furosemide (LASIX) 40 MG tablet TAKE 1/2 TABLET BY MOUTH DAILY 45 tablet 1  . glipiZIDE (GLUCOTROL) 5 MG tablet Take by mouth 5 mg in am and 5 mg before dinner. 180 tablet 3  . glucose blood (ONETOUCH VERIO) test strip Check Blood sugar twice a day. Dx: E11.9 100 each prn  . latanoprost (XALATAN) 0.005 % ophthalmic solution Place 1 drop into both eyes at bedtime.     . metFORMIN (GLUCOPHAGE-XR) 500 MG 24 hr tablet Take 1 tablet (500 mg total) by mouth daily with supper. 90 tablet 0  . ONETOUCH DELICA LANCETS FINE MISC Check blood sugar twice daily. Dx: E11.9 100 each prn  . rosuvastatin (CRESTOR) 5 MG tablet Take 1 tablet (5 mg  total) by mouth at bedtime. 90 tablet 0  . Vitamin D, Ergocalciferol, (DRISDOL) 50000 units CAPS capsule Take 1 capsule (50,000 Units total) by mouth every 7 (seven) days. 4 capsule 2   No current facility-administered medications on file prior to visit.    No Known Allergies Family History  Problem Relation Age of Onset  . Diabetes Sister   . Cancer Sister        stomach  . Stroke Brother   . Coronary artery disease Mother   . Cancer Sister     PE: There were  no vitals taken for this visit.- Wt Readings from Last 3 Encounters:  07/25/17 134 lb (60.8 kg)  03/25/17 131 lb (59.4 kg)  03/21/17 132 lb 12.8 oz (60.2 kg)   Constitutional: normal weight, in NAD Eyes: PERRLA, EOMI, no exophthalmos ENT: moist mucous membranes, no thyromegaly, no cervical lymphadenopathy Cardiovascular: RRR, No MRG Respiratory: CTA B Gastrointestinal: abdomen soft, NT, ND, BS+ Musculoskeletal: no deformities, strength intact in all 4 Skin: moist, warm, no rashes Neurological: no tremor with outstretched hands, DTR normal in all 4  ASSESSMENT: 1. DM2, non-insulin-dependent, controlled, with complications - CKD stage 2-3 - CAD, s/p PCA - CHF  2. HL  PLAN:  1. Patient with long-standing, controlled, type 2 diabetes, with occasional hyperglycemic spikes, but with overall sugars at or close to goal.  At last visit, I advised her to make notes in her log regarding the reason for her hyperglycemic spikes, but we did not make any changes in her regimen.  An HbA1c was excellent, at 6.2%.  - I suggested to:  Patient Instructions  Please continue: - Glipizide 5 mg 2x a day before meals - Metformin ER 500 mg with dinner.  Please write down comments about the sugars if they are out of the ordinary for you.   Please return in 6 months with your sugar log.   - today, HbA1c is 7%  - continue checking sugars at different times of the day - check 1x a day, rotating checks - advised for yearly eye exams >>  she is UTD - Return to clinic in 3 mo with sugar log   2. HL -Reviewed lipid panel from 03/2017: LDL much higher.   - PCP advised her to start Pravachol 20.  She refused;  takes garlic.  Carlus Pavlov, MD PhD [facility] Endocrinology
